# Patient Record
Sex: Male | Born: 1965
Health system: Southern US, Community
[De-identification: ages and names within clinical notes are randomized; demographics above are authoritative.]

## PROBLEM LIST (undated history)

## (undated) DIAGNOSIS — E119 Type 2 diabetes mellitus without complications: Secondary | ICD-10-CM

## (undated) HISTORY — PX: APPENDECTOMY: SHX54

---

## 2008-07-14 ENCOUNTER — Inpatient Hospital Stay (HOSPITAL_COMMUNITY): Admission: EM | Admit: 2008-07-14 | Discharge: 2008-07-16 | Payer: Self-pay | Admitting: Emergency Medicine

## 2008-08-30 ENCOUNTER — Ambulatory Visit: Payer: Self-pay | Admitting: Internal Medicine

## 2008-08-30 LAB — CONVERTED CEMR LAB
Alkaline Phosphatase: 62 units/L (ref 39–117)
BUN: 9 mg/dL (ref 6–23)
CO2: 23 meq/L (ref 19–32)
Cholesterol: 116 mg/dL (ref 0–200)
Creatinine, Ser: 0.94 mg/dL (ref 0.40–1.50)
Glucose, Bld: 203 mg/dL — ABNORMAL HIGH (ref 70–99)
HDL: 33 mg/dL — ABNORMAL LOW (ref >39)
LDL Cholesterol: 68 mg/dL (ref 0–99)
Sodium: 139 meq/L (ref 135–145)
Total Bilirubin: 1.1 mg/dL (ref 0.3–1.2)
Total Protein: 7 g/dL (ref 6.0–8.3)
Triglycerides: 74 mg/dL (ref <150)
VLDL: 15 mg/dL (ref 0–40)

## 2008-08-31 ENCOUNTER — Ambulatory Visit: Payer: Self-pay | Admitting: *Deleted

## 2009-10-10 ENCOUNTER — Emergency Department (HOSPITAL_COMMUNITY): Admission: EM | Admit: 2009-10-10 | Discharge: 2009-10-11 | Payer: Self-pay | Admitting: Emergency Medicine

## 2009-10-11 ENCOUNTER — Ambulatory Visit: Payer: Self-pay | Admitting: Psychiatry

## 2009-10-11 ENCOUNTER — Inpatient Hospital Stay (HOSPITAL_COMMUNITY): Admission: AD | Admit: 2009-10-11 | Discharge: 2009-10-16 | Payer: Self-pay | Admitting: Psychiatry

## 2009-11-01 ENCOUNTER — Ambulatory Visit: Payer: Self-pay | Admitting: Family Medicine

## 2010-05-17 ENCOUNTER — Emergency Department (HOSPITAL_COMMUNITY)
Admission: EM | Admit: 2010-05-17 | Discharge: 2010-05-17 | Payer: Self-pay | Source: Home / Self Care | Admitting: Emergency Medicine

## 2010-08-20 LAB — RAPID STREP SCREEN (MED CTR MEBANE ONLY): Streptococcus, Group A Screen (Direct): NEGATIVE

## 2010-08-27 LAB — GLUCOSE, CAPILLARY
Glucose-Capillary: 121 mg/dL — ABNORMAL HIGH (ref 70–99)
Glucose-Capillary: 125 mg/dL — ABNORMAL HIGH (ref 70–99)
Glucose-Capillary: 132 mg/dL — ABNORMAL HIGH (ref 70–99)
Glucose-Capillary: 134 mg/dL — ABNORMAL HIGH (ref 70–99)
Glucose-Capillary: 146 mg/dL — ABNORMAL HIGH (ref 70–99)
Glucose-Capillary: 149 mg/dL — ABNORMAL HIGH (ref 70–99)
Glucose-Capillary: 156 mg/dL — ABNORMAL HIGH (ref 70–99)
Glucose-Capillary: 169 mg/dL — ABNORMAL HIGH (ref 70–99)
Glucose-Capillary: 190 mg/dL — ABNORMAL HIGH (ref 70–99)
Glucose-Capillary: 195 mg/dL — ABNORMAL HIGH (ref 70–99)
Glucose-Capillary: 196 mg/dL — ABNORMAL HIGH (ref 70–99)
Glucose-Capillary: 220 mg/dL — ABNORMAL HIGH (ref 70–99)

## 2010-08-27 LAB — RAPID URINE DRUG SCREEN, HOSP PERFORMED
Amphetamines: NOT DETECTED
Benzodiazepines: NOT DETECTED
Cocaine: POSITIVE — AB

## 2010-08-27 LAB — COMPREHENSIVE METABOLIC PANEL
AST: 22 U/L (ref 0–37)
Albumin: 4.3 g/dL (ref 3.5–5.2)
BUN: 9 mg/dL (ref 6–23)
Creatinine, Ser: 1.01 mg/dL (ref 0.4–1.5)
GFR calc Af Amer: >60 mL/min (ref >60)
Potassium: 3.7 mEq/L (ref 3.5–5.1)
Total Protein: 7.1 g/dL (ref 6.0–8.3)

## 2010-08-27 LAB — CBC
HCT: 44.4 % (ref 39.0–52.0)
MCV: 93.5 fL (ref 78.0–100.0)
Platelets: 185 10*3/uL (ref 150–400)
RDW: 13.3 % (ref 11.5–15.5)
WBC: 8.8 10*3/uL (ref 4.0–10.5)

## 2010-08-27 LAB — DIFFERENTIAL
Eosinophils Relative: 2 % (ref 0–5)
Lymphocytes Relative: 28 % (ref 12–46)
Monocytes Absolute: 0.8 10*3/uL (ref 0.1–1.0)
Monocytes Relative: 9 % (ref 3–12)
Neutro Abs: 5.3 10*3/uL (ref 1.7–7.7)

## 2010-08-27 LAB — HEMOGLOBIN A1C
Hgb A1c MFr Bld: 6.8 % — ABNORMAL HIGH (ref <5.7)
Mean Plasma Glucose: 148 mg/dL — ABNORMAL HIGH (ref <117)

## 2010-08-27 LAB — ETHANOL: Alcohol, Ethyl (B): 51 mg/dL — ABNORMAL HIGH (ref 0–10)

## 2010-09-24 LAB — GLUCOSE, CAPILLARY
Glucose-Capillary: 150 mg/dL — ABNORMAL HIGH (ref 70–99)
Glucose-Capillary: 187 mg/dL — ABNORMAL HIGH (ref 70–99)
Glucose-Capillary: 270 mg/dL — ABNORMAL HIGH (ref 70–99)
Glucose-Capillary: 273 mg/dL — ABNORMAL HIGH (ref 70–99)
Glucose-Capillary: 277 mg/dL — ABNORMAL HIGH (ref 70–99)
Glucose-Capillary: 353 mg/dL — ABNORMAL HIGH (ref 70–99)
Glucose-Capillary: 364 mg/dL — ABNORMAL HIGH (ref 70–99)

## 2010-09-24 LAB — BASIC METABOLIC PANEL
BUN: 12 mg/dL (ref 6–23)
BUN: 13 mg/dL (ref 6–23)
BUN: 13 mg/dL (ref 6–23)
BUN: 14 mg/dL (ref 6–23)
BUN: 15 mg/dL (ref 6–23)
BUN: 9 mg/dL (ref 6–23)
CO2: 22 mEq/L (ref 19–32)
CO2: 22 mEq/L (ref 19–32)
CO2: 22 mEq/L (ref 19–32)
CO2: 22 mEq/L (ref 19–32)
CO2: 23 mEq/L (ref 19–32)
Calcium: 7.9 mg/dL — ABNORMAL LOW (ref 8.4–10.5)
Calcium: 8.1 mg/dL — ABNORMAL LOW (ref 8.4–10.5)
Calcium: 8.2 mg/dL — ABNORMAL LOW (ref 8.4–10.5)
Calcium: 8.2 mg/dL — ABNORMAL LOW (ref 8.4–10.5)
Chloride: 103 mEq/L (ref 96–112)
Chloride: 103 mEq/L (ref 96–112)
Chloride: 103 mEq/L (ref 96–112)
Chloride: 105 mEq/L (ref 96–112)
Chloride: 105 mEq/L (ref 96–112)
Chloride: 108 mEq/L (ref 96–112)
Creatinine, Ser: 0.92 mg/dL (ref 0.4–1.5)
Creatinine, Ser: 0.99 mg/dL (ref 0.4–1.5)
Creatinine, Ser: 1.1 mg/dL (ref 0.4–1.5)
Creatinine, Ser: 1.19 mg/dL (ref 0.4–1.5)
Creatinine, Ser: 1.2 mg/dL (ref 0.4–1.5)
GFR calc Af Amer: >60 mL/min (ref >60)
GFR calc Af Amer: >60 mL/min (ref >60)
GFR calc Af Amer: >60 mL/min (ref >60)
GFR calc non Af Amer: >60 mL/min (ref >60)
Glucose, Bld: 113 mg/dL — ABNORMAL HIGH (ref 70–99)
Glucose, Bld: 201 mg/dL — ABNORMAL HIGH (ref 70–99)
Glucose, Bld: 275 mg/dL — ABNORMAL HIGH (ref 70–99)
Glucose, Bld: 356 mg/dL — ABNORMAL HIGH (ref 70–99)
Potassium: 3.1 mEq/L — ABNORMAL LOW (ref 3.5–5.1)
Potassium: 3.3 mEq/L — ABNORMAL LOW (ref 3.5–5.1)
Potassium: 3.5 mEq/L (ref 3.5–5.1)
Potassium: 3.7 mEq/L (ref 3.5–5.1)
Sodium: 133 mEq/L — ABNORMAL LOW (ref 135–145)

## 2010-09-24 LAB — COMPREHENSIVE METABOLIC PANEL
ALT: 16 U/L (ref 0–53)
AST: 22 U/L (ref 0–37)
AST: 23 U/L (ref 0–37)
Albumin: 2.7 g/dL — ABNORMAL LOW (ref 3.5–5.2)
Albumin: 4.4 g/dL (ref 3.5–5.2)
Alkaline Phosphatase: 103 U/L (ref 39–117)
Alkaline Phosphatase: 68 U/L (ref 39–117)
Calcium: 8.1 mg/dL — ABNORMAL LOW (ref 8.4–10.5)
Chloride: 91 mEq/L — ABNORMAL LOW (ref 96–112)
Creatinine, Ser: 1.98 mg/dL — ABNORMAL HIGH (ref 0.4–1.5)
GFR calc Af Amer: 45 mL/min — ABNORMAL LOW (ref >60)
GFR calc Af Amer: >60 mL/min (ref >60)
Potassium: 3.8 mEq/L (ref 3.5–5.1)
Potassium: 5.1 mEq/L (ref 3.5–5.1)
Sodium: 138 mEq/L (ref 135–145)
Total Bilirubin: 2.1 mg/dL — ABNORMAL HIGH (ref 0.3–1.2)
Total Protein: 4.9 g/dL — ABNORMAL LOW (ref 6.0–8.3)

## 2010-09-24 LAB — CBC
HCT: 36.4 % — ABNORMAL LOW (ref 39.0–52.0)
Hemoglobin: 12.9 g/dL — ABNORMAL LOW (ref 13.0–17.0)
MCHC: 34.3 g/dL (ref 30.0–36.0)
MCHC: 34.9 g/dL (ref 30.0–36.0)
MCV: 91.3 fL (ref 78.0–100.0)
Platelets: 121 10*3/uL — ABNORMAL LOW (ref 150–400)
Platelets: 129 10*3/uL — ABNORMAL LOW (ref 150–400)
Platelets: 166 10*3/uL (ref 150–400)
RDW: 12.9 % (ref 11.5–15.5)
RDW: 13 % (ref 11.5–15.5)
WBC: 12.6 10*3/uL — ABNORMAL HIGH (ref 4.0–10.5)

## 2010-09-24 LAB — URINALYSIS, ROUTINE W REFLEX MICROSCOPIC
Bilirubin Urine: NEGATIVE
Glucose, UA: >1000 mg/dL — AB
Specific Gravity, Urine: 1.039 — ABNORMAL HIGH (ref 1.005–1.030)
Urobilinogen, UA: 0.2 mg/dL (ref 0.0–1.0)

## 2010-09-24 LAB — POCT I-STAT, CHEM 8
BUN: 19 mg/dL (ref 6–23)
Calcium, Ion: 1.14 mmol/L (ref 1.12–1.32)
Chloride: 104 mEq/L (ref 96–112)
Creatinine, Ser: 1.2 mg/dL (ref 0.4–1.5)
Creatinine, Ser: 1.2 mg/dL (ref 0.4–1.5)
Glucose, Bld: 349 mg/dL — ABNORMAL HIGH (ref 70–99)
Hemoglobin: 16.7 g/dL (ref 13.0–17.0)
Hemoglobin: 17.7 g/dL — ABNORMAL HIGH (ref 13.0–17.0)
Potassium: 4.3 mEq/L (ref 3.5–5.1)
Sodium: 135 mEq/L (ref 135–145)
TCO2: 18 mmol/L (ref 0–100)

## 2010-09-24 LAB — URINE MICROSCOPIC-ADD ON

## 2010-09-24 LAB — DRUGS OF ABUSE SCREEN W/O ALC, ROUTINE URINE
Amphetamine Screen, Ur: NEGATIVE
Barbiturate Quant, Ur: NEGATIVE
Benzodiazepines.: NEGATIVE
Creatinine,U: 28.1 mg/dL
Marijuana Metabolite: NEGATIVE
Methadone: NEGATIVE

## 2010-09-24 LAB — DIFFERENTIAL
Basophils Absolute: 0 10*3/uL (ref 0.0–0.1)
Eosinophils Relative: 0 % (ref 0–5)
Lymphocytes Relative: 11 % — ABNORMAL LOW (ref 12–46)
Monocytes Absolute: 0.7 10*3/uL (ref 0.1–1.0)
Monocytes Relative: 6 % (ref 3–12)

## 2010-09-24 LAB — LIPID PANEL
LDL Cholesterol: 82 mg/dL (ref 0–99)
VLDL: 30 mg/dL (ref 0–40)

## 2010-09-24 LAB — TROPONIN I: Troponin I: 0.02 ng/mL (ref 0.00–0.06)

## 2010-09-24 LAB — RETICULOCYTES
RBC.: 4.21 MIL/uL — ABNORMAL LOW (ref 4.22–5.81)
Retic Ct Pct: 0.5 % (ref 0.4–3.1)

## 2010-09-24 LAB — URINE CULTURE: Special Requests: NEGATIVE

## 2010-09-24 LAB — PHOSPHORUS
Phosphorus: 1.4 mg/dL — ABNORMAL LOW (ref 2.3–4.6)
Phosphorus: 1.7 mg/dL — ABNORMAL LOW (ref 2.3–4.6)
Phosphorus: 2.2 mg/dL — ABNORMAL LOW (ref 2.3–4.6)
Phosphorus: 2.8 mg/dL (ref 2.3–4.6)
Phosphorus: 2.8 mg/dL (ref 2.3–4.6)

## 2010-09-24 LAB — IRON AND TIBC
Iron: 139 ug/dL — ABNORMAL HIGH (ref 42–135)
UIBC: 63 ug/dL

## 2010-09-24 LAB — HEMOGLOBIN A1C: Hgb A1c MFr Bld: 15 % — ABNORMAL HIGH (ref 4.6–6.1)

## 2010-09-24 LAB — MAGNESIUM
Magnesium: 2 mg/dL (ref 1.5–2.5)
Magnesium: 2.2 mg/dL (ref 1.5–2.5)
Magnesium: 2.3 mg/dL (ref 1.5–2.5)
Magnesium: 2.6 mg/dL — ABNORMAL HIGH (ref 1.5–2.5)

## 2010-09-24 LAB — PROTIME-INR
INR: 1 (ref 0.00–1.49)
Prothrombin Time: 13.8 seconds (ref 11.6–15.2)

## 2010-09-24 LAB — FERRITIN: Ferritin: 542 ng/mL — ABNORMAL HIGH (ref 22–322)

## 2010-09-24 LAB — BRAIN NATRIURETIC PEPTIDE: Pro B Natriuretic peptide (BNP): 73 pg/mL (ref 0.0–100.0)

## 2010-09-24 LAB — TSH: TSH: 0.574 u[IU]/mL (ref 0.350–4.500)

## 2010-09-24 LAB — CULTURE, BLOOD (ROUTINE X 2): Culture: NO GROWTH

## 2010-09-24 LAB — CK TOTAL AND CKMB (NOT AT ARMC)
CK, MB: 3.3 ng/mL (ref 0.3–4.0)
Total CK: 243 U/L — ABNORMAL HIGH (ref 7–232)

## 2010-09-24 LAB — AMYLASE: Amylase: 49 U/L (ref 27–131)

## 2010-10-22 NOTE — H&P (Signed)
NAMECAMBRIDGE, DELEO NO.:  [id number]   MEDICAL RECORD NO.:  [id number]          PATIENT TYPE:  INP   LOCATION:  5123                         FACILITY:  [facility]   PHYSICIAN:  Carlena Hurl, MDDATE OF BIRTH:  September 20, 1965   DATE OF ADMISSION:  07/13/2008  DATE OF DISCHARGE:                              HISTORY & PHYSICAL   CHIEF COMPLAINT:  Abdominal pain, nausea, vomiting, and feeling very  weak and dizzy.   HISTORY OF PRESENT ILLNESS:  This is a 45 year old African American  gentleman with no past medical history who is coming into the ER with 2  days' history of abdominal pain, nausea, vomiting, and feeling  dizziness.  The history is obtained with the patient as well as the  patient's girlfriend who is at the bedside.  So according to them, this  patient was absolutely fine 2 days prior starting this episode.  He says  that on Wednesday, that was 2 days ago, the patient started having  abdominal pain in the midepigastric region.  He is also having severe  nausea and vomiting, he was unable to keep anything down except for the  Sprite, which he drank.  He was also feeling very dizzy.  His girlfriend  says that the patient was walking very lethargically and he was feeling  dizzy.  He denies having any fever, constipation, diarrhea, no headache.  There is no weight loss.  This patient was having increased frequency of  urination and he was feeling very thirsty since December 2009.   PAST MEDICAL HISTORY:  Nothing significant.   PAST SURGICAL HISTORY:  He had 1 tooth removed, but otherwise not  significant.   ALLERGIES:  No known drug allergies.   SOCIAL HISTORY:  The patient is a heavy smoker.  He smokes about 1 pack  in 2 days.  He has been smoking for the past 10 years.  The patient has  a girlfriend and lives with her.  He denies alcohol or IV drug abuse.   REVIEW OF SYSTEMS:  He has increased frequency of urination, feeling  very thirsty,  abdominal pain, nausea, vomiting.   MEDICATIONS:  He takes ibuprofen as needed for pain, otherwise none.   PHYSICAL EXAMINATION:  GENERAL:  This is a 45 year old African American  gentleman who is lying comfortably on the bed without any severe chest  pain or shortness of breath.  VITALS:  Blood pressure when he came into the ER 142/89 and later  dropped down to 136/90, pulse rate of 95, which later dropped down to  74, respiratory rate of 18, temperature 96.9.  HEENT:  Head is atraumatic, normocephalic.  PERRLA.  Tympanic membranes  intact.  No discharge from eyes or ears.  NECK:  Supple.  No JVD noted.  No goiter appreciated.  LUNGS:  Clear to auscultation bilaterally.  CVS:  S1 and S2 heard with a regular rate and rhythm.  ABDOMEN:  Soft.  Bowel sounds present, nontender, nondistended.  EXTREMITIES:  No pedal edema noted.  Pulses are palpable bilaterally.  No cyanosis.  No clubbing.  CVS:  Awake, alert, and  oriented x3.  No focal neurological deficits  noted.   LABORATORY DATA:  WBC of 12.6, hemoglobin 7.0, hematocrit of 50.5,  platelets of 166, neutrophils 86%.  Sodium 124, potassium 5.1, chloride  91, bicarb of 15, glucose of 642, creatinine 1.98.  Total bilirubin 2.1,  alkaline phosphatase of 103, AST and ALT are within normal limits.  Lipase of 58.  Urinalysis shows urine glucose greater than 1000, pH of  5.5, moderate amount of blood, but otherwise no nitrites and no wbc  noted.   ASSESSMENT AND PLAN:  This is a  45 year old gentleman who is coming in  with 2 days' episode of abdominal pain, nausea, vomiting, and 67-month  history of increased frequency of urination, feeling thirsty.  1. Acute diabetic ketoacidosis secondary to new onset diabetes      mellitus.  The patient's anion gap is 18.  At this time clinically,      the patient is stable.  So, the patient has been started in the ER      with IV insulin and the patient was given IV fluid bolus as well as      now the  IV fluids are wide open.  At this time, we will continue      this patient on IV insulin, NovoLog insulin, at the rate of 0.1      units/kg/hour.  Also, we will check his blood glucose every 1 hour      and Chem-7, magnesium, phosphorus every 4 hours.  So, once the      patient's anion gap closes, we will add Lantus subcutaneously and      slowly taper the IV insulin.  2. New onset diabetes mellitus.  The patient states that his      grandparents and uncles have history of diabetes, so as the patient      never had any history of diabetes, but had 108-month history of      increased urination and increased thirsty, at this time, I feel it      is new onset diabetes mellitus.  Once his diabetic ketoacidosis      resolves, we will start this patient on oral medications and get a      diabetic education.  3. Hyponatremia, it is most likely secondary to diabetic ketoacidosis      and we will monitor his sodium.  Once the patient's diabetic      ketoacidosis resolves, his sodium should normalize.  4. Hyperkalemia.  It is secondary to diabetic ketoacidosis.  5. Acute kidney injury.  The patient denies having any previous      history of kidney injuries.  At this time, it is most likely      secondary to prerenal condition secondary to dehydration for      diabetic ketoacidosis.  So, the patient has been given IV normal      saline.  So, we will monitor his creatinine.  6. History of smoking.  The patient will be given smoking education at      the end of the discharge.  7. Deep venous thrombosis prophylaxis.  The patient will be started on      heparin 5000 units subcu 1 time a day.  8. Gastrointestinal prophylaxis, the patient will be placed on      Protonix IV 40 mg 1 time a day.      Carlena Hurl, MD  Electronically Signed     JD/MEDQ  D:  07/14/2008  T:  07/14/2008  Job:  [id number]

## 2010-10-22 NOTE — Discharge Summary (Signed)
[name], [name] NO.:  [id number]   MEDICAL RECORD NO.:  [id number]          PATIENT TYPE:  INP   LOCATION:  5123                         FACILITY:  [facility]   PHYSICIAN:  Peggye Pitt, M.D. DATE OF BIRTH:  11/17/1965   DATE OF ADMISSION:  07/13/2008  DATE OF DISCHARGE:  07/16/2008                               DISCHARGE SUMMARY   DISCHARGE DIAGNOSES:  1. Diabetic ketoacidosis, resolved.  2. Type 1 diabetes mellitus, newly diagnosed.   DISCHARGE MEDICATION:  Insulin 70/30 15 units in the morning before  breakfast and 12 units in the evening before dinner.   DISPOSITION AND FOLLOWUP:  The patient is discharged home in a stable  condition.  His diabetic ketoacidosis has resolved.  I have recommended  that he have a close follow up with a primary care physician to further  adjust his insulin and keep him out of DKA.  He will need a further  diabetic teaching including diet and insulin instruction.  He does not  currently have a primary care physician, so I have given him the number  for [facility] so that he may call on Monday and schedule this  appointment.   CONSULTATIONS IN THIS HOSPITALIZATION:  None.   IMAGES AND PROCEDURES:  Performed during this hospitalization include a  chest x-ray on July 14, 2008, that showed no active cardiopulmonary  disease.   HISTORY AND PHYSICAL EXAMINATION:  For full details, please refer to  history and physical dictated on July 14, 2008, by Dr. Cherylin Mylar,  but in brief, [name] is a very pleasant 45 year old African  American man with no significant past medical history who came into the  emergency department with 2-day history of abdominal pain, nausea,  vomiting, and dizziness.  He was diagnosed with diabetic ketoacidosis  and for that reason, the Medicine and Hospitalist Service was called for  further evaluation and management.   HOSPITAL COURSE:  1. Diabetic ketoacidosis.  He was immediately and  aggressively fluid      repleted with normal saline.  He was also started on an insulin      drip with frequent CBGs as well as chemistry and electrolyte      measurements.  Once his DKA had resolved, he was transitioned over      to a subcutaneous regimen of insulin 70/30.  He has tolerated this      well.  Upon discharge, his CBGs remained quite elevated in the high      200s to low 300s, but his CBGs and his insulin can be further      monitored and titrated as an outpatient.  He has been provided with      diabetic teaching in the hospital by the ways of nutrition and      insulin administration, although he will likely need further      aggressive diabetic teaching in the outpatient setting.  2. Vital signs upon discharge; blood pressure 119/79, heart rate 63,      respirations 16, and O2 sats 98% on room air      with a temperature  of 99.0.  3. Labs on day of discharge; sodium 135, potassium 3.5, chloride 103,      bicarb 24, BUN 9, creatinine 0.92 with a glucose of 275.  WBC is      5.4, hemoglobin 12.5, and platelets of 129.  His hemoglobin A1c was      15.      Peggye Pitt, M.D.  Electronically Signed     EH/MEDQ  D:  07/16/2008  T:  07/17/2008  Job:  [id number]

## 2013-06-28 ENCOUNTER — Emergency Department (HOSPITAL_BASED_OUTPATIENT_CLINIC_OR_DEPARTMENT_OTHER): Payer: Self-pay

## 2013-06-28 ENCOUNTER — Emergency Department (HOSPITAL_BASED_OUTPATIENT_CLINIC_OR_DEPARTMENT_OTHER)
Admission: EM | Admit: 2013-06-28 | Discharge: 2013-06-28 | Disposition: A | Discharge status: Home / Self Care | Payer: Self-pay | Attending: Emergency Medicine | Admitting: Emergency Medicine

## 2013-06-28 ENCOUNTER — Encounter (HOSPITAL_BASED_OUTPATIENT_CLINIC_OR_DEPARTMENT_OTHER): Payer: Self-pay | Admitting: Emergency Medicine

## 2013-06-28 DIAGNOSIS — F17200 Nicotine dependence, unspecified, uncomplicated: Secondary | ICD-10-CM | POA: Insufficient documentation

## 2013-06-28 DIAGNOSIS — Z794 Long term (current) use of insulin: Secondary | ICD-10-CM | POA: Insufficient documentation

## 2013-06-28 DIAGNOSIS — E109 Type 1 diabetes mellitus without complications: Secondary | ICD-10-CM | POA: Insufficient documentation

## 2013-06-28 DIAGNOSIS — R079 Chest pain, unspecified: Secondary | ICD-10-CM | POA: Insufficient documentation

## 2013-06-28 HISTORY — DX: Type 2 diabetes mellitus without complications: E11.9

## 2013-06-28 LAB — TROPONIN I: Troponin I: <0.3 ng/mL (ref <0.30)

## 2013-06-28 LAB — COMPREHENSIVE METABOLIC PANEL
ALT: 25 U/L (ref 0–53)
AST: 19 U/L (ref 0–37)
Albumin: 4.3 g/dL (ref 3.5–5.2)
Alkaline Phosphatase: 69 U/L (ref 39–117)
BUN: 14 mg/dL (ref 6–23)
CO2: 26 mEq/L (ref 19–32)
Calcium: 9.4 mg/dL (ref 8.4–10.5)
Chloride: 100 mEq/L (ref 96–112)
Creatinine, Ser: 0.9 mg/dL (ref 0.50–1.35)
GFR calc Af Amer: >90 mL/min (ref >90)
GFR calc non Af Amer: >90 mL/min (ref >90)
Glucose, Bld: 177 mg/dL — ABNORMAL HIGH (ref 70–99)
Potassium: 4 mEq/L (ref 3.7–5.3)
Sodium: 140 mEq/L (ref 137–147)
Total Bilirubin: 0.5 mg/dL (ref 0.3–1.2)
Total Protein: 7.1 g/dL (ref 6.0–8.3)

## 2013-06-28 LAB — CBC WITH DIFFERENTIAL/PLATELET
Basophils Absolute: 0 10*3/uL (ref 0.0–0.1)
Basophils Relative: 0 % (ref 0–1)
Eosinophils Absolute: 0.2 10*3/uL (ref 0.0–0.7)
Eosinophils Relative: 2 % (ref 0–5)
HCT: 44.7 % (ref 39.0–52.0)
Hemoglobin: 15.2 g/dL (ref 13.0–17.0)
Lymphocytes Relative: 34 % (ref 12–46)
Lymphs Abs: 3.4 10*3/uL (ref 0.7–4.0)
MCH: 30 pg (ref 26.0–34.0)
MCHC: 34 g/dL (ref 30.0–36.0)
MCV: 88.2 fL (ref 78.0–100.0)
Monocytes Absolute: 0.9 10*3/uL (ref 0.1–1.0)
Monocytes Relative: 8 % (ref 3–12)
Neutro Abs: 5.6 10*3/uL (ref 1.7–7.7)
Neutrophils Relative %: 55 % (ref 43–77)
Platelets: 179 10*3/uL (ref 150–400)
RBC: 5.07 MIL/uL (ref 4.22–5.81)
RDW: 12.7 % (ref 11.5–15.5)
WBC: 10.1 10*3/uL (ref 4.0–10.5)

## 2013-06-28 LAB — LIPASE, BLOOD: LIPASE: 26 U/L (ref 11–59)

## 2013-06-28 LAB — D-DIMER, QUANTITATIVE (NOT AT ARMC)

## 2013-06-28 MED ORDER — KETOROLAC TROMETHAMINE 30 MG/ML IJ SOLN
30.0000 mg | Freq: Once | INTRAMUSCULAR | Status: AC
  Administered 2013-06-28: 30 mg via INTRAVENOUS
  Filled 2013-06-28: qty 1

## 2013-06-28 NOTE — ED Notes (Signed)
States he has been having sharp left sided chest pain that sometimes goes to the center of his chest and under his ribs for a month. Recently he has experienced SOB with the pain.

## 2013-06-28 NOTE — Discharge Instructions (Signed)
Ibuprofen 600 mg 3 times daily for the next 5 days.  If your symptoms are not improving in the next week, followup with your primary Dr. If symptoms worsen or change in the meantime, return to the ER to be reevaluated.   Chest Pain (Nonspecific) It is often hard to give a specific diagnosis for the cause of chest pain. There is always a chance that your pain could be related to something serious, such as a heart attack or a blood clot in the lungs. You need to follow up with your caregiver for further evaluation. CAUSES   Heartburn.  Pneumonia or bronchitis.  Anxiety or stress.  Inflammation around your heart (pericarditis) or lung (pleuritis or pleurisy).  A blood clot in the lung.  A collapsed lung (pneumothorax). It can develop suddenly on its own (spontaneous pneumothorax) or from injury (trauma) to the chest.  Shingles infection (herpes zoster virus). The chest wall is composed of bones, muscles, and cartilage. Any of these can be the source of the pain.  The bones can be bruised by injury.  The muscles or cartilage can be strained by coughing or overwork.  The cartilage can be affected by inflammation and become sore (costochondritis). DIAGNOSIS  Lab tests or other studies, such as X-rays, electrocardiography, stress testing, or cardiac imaging, may be needed to find the cause of your pain.  TREATMENT   Treatment depends on what may be causing your chest pain. Treatment may include:  Acid blockers for heartburn.  Anti-inflammatory medicine.  Pain medicine for inflammatory conditions.  Antibiotics if an infection is present.  You may be advised to change lifestyle habits. This includes stopping smoking and avoiding alcohol, caffeine, and chocolate.  You may be advised to keep your head raised (elevated) when sleeping. This reduces the chance of acid going backward from your stomach into your esophagus.  Most of the time, nonspecific chest pain will improve within 2  to 3 days with rest and mild pain medicine. HOME CARE INSTRUCTIONS   If antibiotics were prescribed, take your antibiotics as directed. Finish them even if you start to feel better.  For the next few days, avoid physical activities that bring on chest pain. Continue physical activities as directed.  Do not smoke.  Avoid drinking alcohol.  Only take over-the-counter or prescription medicine for pain, discomfort, or fever as directed by your caregiver.  Follow your caregiver's suggestions for further testing if your chest pain does not go away.  Keep any follow-up appointments you made. If you do not go to an appointment, you could develop lasting (chronic) problems with pain. If there is any problem keeping an appointment, you must call to reschedule. SEEK MEDICAL CARE IF:   You think you are having problems from the medicine you are taking. Read your medicine instructions carefully.  Your chest pain does not go away, even after treatment.  You develop a rash with blisters on your chest. SEEK IMMEDIATE MEDICAL CARE IF:   You have increased chest pain or pain that spreads to your arm, neck, jaw, back, or abdomen.  You develop shortness of breath, an increasing cough, or you are coughing up blood.  You have severe back or abdominal pain, feel nauseous, or vomit.  You develop severe weakness, fainting, or chills.  You have a fever. THIS IS AN EMERGENCY. Do not wait to see if the pain will go away. Get medical help at once. Call your local emergency services (911 in [place] Do not drive yourself to  the hospital. MAKE SURE YOU:   Understand these instructions.  Will watch your condition.  Will get help right away if you are not doing well or get worse. Document Released: 03/05/2005 Document Revised: 08/18/2011 Document Reviewed: 12/30/2007 [facility] Patient Information 2014 [place].

## 2013-06-28 NOTE — ED Provider Notes (Signed)
CSN: [id number]     Arrival date & time 06/28/13  1618 History   First MD Initiated Contact with Patient 06/28/13 1629     Chief Complaint  Patient presents with  . Chest Pain   (Consider location/radiation/quality/duration/timing/severity/associated sxs/prior Treatment) HPI Comments: Patient is a 48 year old male with history of type 1 diabetes diagnosed in 2010. He presents today with complaints of sharp pains in the front of his chest which have been present for the past 2 months. He denies any injury or trauma and denies any shortness of breath, nausea, diaphoresis, or radiation to the arm or jaw. He states his pain is worse with movement and position. 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 He has no prior cardiac history and has no other cardiac risk factors.  Patient is a 48 y.o. male presenting with chest pain. The history is provided by the patient.  Chest Pain Chest pain location: Anterior chest. Pain quality: sharp   Pain radiates to:  Does not radiate Pain radiates to the back: no   Pain severity:  Moderate Onset quality:  Gradual Duration:  2 months Timing:  Constant Progression:  Worsening Chronicity:  New Context: lifting and movement   Context: not breathing and not eating   Relieved by:  Nothing Worsened by:  Certain positions and movement Ineffective treatments:  None tried   Past Medical History  Diagnosis Date  . Diabetes mellitus without complication    History reviewed. No pertinent past surgical history. No family history on file. History  Substance Use Topics  . Smoking status: Current Every Day Smoker -- 0.50 packs/day    Types: Cigarettes  . Smokeless tobacco: Not on file  . Alcohol Use: No    Review of Systems  Cardiovascular: Positive for chest pain.  All other systems reviewed and are negative.    Allergies  Review of patient's allergies indicates no known allergies.  Home Medications   Current Outpatient  Rx  Name  Route  Sig  Dispense  Refill  . insulin NPH Human (HUMULIN N,NOVOLIN N) 100 UNIT/ML injection   Subcutaneous   Inject into the skin.         Marland Kitchen METFORMIN HCL PO   Oral   Take by mouth.          BP 136/92  Pulse 74  Temp(Src) 98.2 F (36.8 C) (Oral)  Resp 18  Ht 5\' 11"  (1.803 m)  Wt 200 lb (90.719 kg)  BMI 27.91 kg/m2  SpO2 98% Physical Exam  Nursing note and vitals reviewed. Constitutional: He is oriented to person, place, and time. He appears well-developed and well-nourished. No distress.  HENT:  Head: Normocephalic and atraumatic.  Mouth/Throat: Oropharynx is clear and moist.  Neck: Normal range of motion. Neck supple.  Cardiovascular: Normal rate, regular rhythm and normal heart sounds.   No murmur heard. Pulmonary/Chest: Effort normal and breath sounds normal. No respiratory distress. He has no wheezes.  Abdominal: Soft. Bowel sounds are normal. He exhibits no distension and no mass. There is no tenderness. There is no rebound and no guarding.  Musculoskeletal: Normal range of motion. He exhibits no edema.  Neurological: He is alert and oriented to person, place, and time.  Skin: Skin is warm and dry. He is not diaphoretic.    ED Course  Procedures (including critical care time) Labs Review Labs Reviewed  CBC WITH DIFFERENTIAL  COMPREHENSIVE METABOLIC PANEL  TROPONIN I  LIPASE, BLOOD  D-DIMER, QUANTITATIVE  Imaging Review No results found.  EKG Interpretation    Date/Time:  Tuesday June 28 2013 16:24:00 EST Ventricular Rate:  74 PR Interval:  148 QRS Duration: 94 QT Interval:  362 QTC Calculation: 401 R Axis:   -9 Text Interpretation:  Normal sinus rhythm Possible Anterior infarct , age undetermined Abnormal ECG Confirmed by DELOS  MD, Letia Guidry ([id number]) on 06/28/2013 4:29:38 PM            MDM  No diagnosis found. Patient is a 48 year old male presents with complaints of pain in the center of his chest for the past 2 months. He  denies any injury or trauma. His symptoms do not sound cardiac in nature. His EKG and troponin are both negative. Other considerations are PE, however his d-dimer is negative and I have a low suspicion. I do not feel as though further testing is indicated into this. There is no evidence for pneumothorax, neoplasm, dissection, or other acute pathology on his chest x-ray. His symptoms sound musculoskeletal in nature. I will recommend rest, ibuprofen, and time. It is not improving he is to followup with his primary Dr. and return to the emergency department if his symptoms substantially worsen or change.    Geoffery Lyonsouglas Florance Paolillo, MD 06/28/13 [phone]

## 2013-12-27 ENCOUNTER — Encounter (HOSPITAL_BASED_OUTPATIENT_CLINIC_OR_DEPARTMENT_OTHER): Payer: Self-pay | Admitting: Emergency Medicine

## 2013-12-27 ENCOUNTER — Emergency Department (HOSPITAL_BASED_OUTPATIENT_CLINIC_OR_DEPARTMENT_OTHER)
Admission: EM | Admit: 2013-12-27 | Discharge: 2013-12-27 | Disposition: A | Discharge status: Home / Self Care | Payer: Self-pay | Attending: Emergency Medicine | Admitting: Emergency Medicine

## 2013-12-27 DIAGNOSIS — E119 Type 2 diabetes mellitus without complications: Secondary | ICD-10-CM | POA: Insufficient documentation

## 2013-12-27 DIAGNOSIS — Z794 Long term (current) use of insulin: Secondary | ICD-10-CM | POA: Insufficient documentation

## 2013-12-27 DIAGNOSIS — F17200 Nicotine dependence, unspecified, uncomplicated: Secondary | ICD-10-CM | POA: Insufficient documentation

## 2013-12-27 DIAGNOSIS — K029 Dental caries, unspecified: Secondary | ICD-10-CM | POA: Insufficient documentation

## 2013-12-27 MED ORDER — BUPIVACAINE-EPINEPHRINE (PF) 0.5% -1:200000 IJ SOLN
1.8000 mL | Freq: Once | INTRAMUSCULAR | Status: AC
  Administered 2013-12-27: 1.8 mL

## 2013-12-27 MED ORDER — BUPIVACAINE-EPINEPHRINE (PF) 0.5% -1:200000 IJ SOLN
INTRAMUSCULAR | Status: AC
  Administered 2013-12-27: 1.8 mL
  Filled 2013-12-27: qty 1.8

## 2013-12-27 MED ORDER — PENICILLIN V POTASSIUM 500 MG PO TABS: 500.0000 mg | ORAL_TABLET | Freq: Four times a day (QID) | ORAL | Status: AC

## 2013-12-27 MED ORDER — HYDROCODONE-ACETAMINOPHEN 5-325 MG PO TABS: 1.0000 | ORAL_TABLET | Freq: Four times a day (QID) | ORAL | Status: DC | PRN

## 2013-12-27 NOTE — Discharge Instructions (Signed)

## 2013-12-27 NOTE — ED Notes (Signed)
MD at bedside. 

## 2013-12-27 NOTE — ED Notes (Signed)
Pt. Has an abscess in the gum above a broken tooth in the R upper quadrant.  Pt. Has noted facial edema with pain in his face.

## 2013-12-27 NOTE — ED Provider Notes (Signed)
CSN: [id number]     Arrival date & time 12/27/13  2145 History  This chart was scribed for Hanley Seamen, MD by Bronson Curb, ED Scribe. This patient was seen in room MH03/MH03 and the patient's care was started at 11:04 PM.    Chief Complaint  Patient presents with  . Dental Pain      The history is provided by the patient. No language interpreter was used.    HPI Comments: [name] is a 48 y.o. male who presents to the Emergency Department complaining of moderate to severe, right upper dental pain onset 3 days ago caused by a broken tooth. Patient states the pain radiates to his right eye. There is associated right sided facial pain and mild swelling. Pain is worse with eating or drinking. He does not have a Education officer, community.   Past Medical History  Diagnosis Date  . Diabetes mellitus without complication    History reviewed. No pertinent past surgical history. No family history on file. History  Substance Use Topics  . Smoking status: Current Every Day Smoker -- 0.50 packs/day    Types: Cigarettes  . Smokeless tobacco: Not on file  . Alcohol Use: No    Review of Systems  A complete 10 system review of systems was obtained and all systems are negative except as noted in the HPI and PMH.    Allergies  Review of patient's allergies indicates no known allergies.  Home Medications   Prior to Admission medications   Medication Sig Start Date End Date Taking? Authorizing Provider  insulin NPH Human (HUMULIN N,NOVOLIN N) 100 UNIT/ML injection Inject into the skin.    Historical Provider, MD  METFORMIN HCL PO Take by mouth.    Historical Provider, MD   Triage Vitals: BP 122/81  Resp 18  Ht 5\' 10"  (1.778 m)  Wt 195 lb (88.451 kg)  BMI 27.98 kg/m2  SpO2 98%  Physical Exam General: Well-developed, well-nourished male in no acute distress; appearance consistent with age of record HENT: normocephalic; atraumatic. Fractured, carious right upper 1st premolar, tender to  percussion; mild edema of adjacent soft tissue. Eyes: pupils equal, round and reactive to light; extraocular muscles intact Neck: supple. No lymphadenopathy.  Heart: regular rate and rhythm Lungs: clear to auscultation bilaterally Abdomen: soft; nondistended; nontender; no masses or hepatosplenomegaly; bowel sounds present Extremities: No deformity; full range of motion Neurologic: Awake, alert and oriented; motor function intact in all extremities and symmetric; no facial droop Skin: Warm and dry Psychiatric: Normal mood and affect    ED Course  Procedures (including critical care time)  DIAGNOSTIC STUDIES: Oxygen Saturation is 98% on room air, normal by my interpretation.    COORDINATION OF CARE:  11:09 PM- Pt advised of plan for treatment and pt agrees.  DENTAL BLOCK 0.5% with epinephrine was injected into the buccal fold adjacent to the right upper first premolar. Adequate analgesia was obtained. The patient tolerated this well and there were no immediate complications.  MDM  I personally performed the services described in this documentation, which was scribed in my presence. The recorded information has been reviewed and is accurate.   Hanley Seamen, MD 12/27/13 [phone]

## 2014-05-03 ENCOUNTER — Emergency Department (HOSPITAL_BASED_OUTPATIENT_CLINIC_OR_DEPARTMENT_OTHER)
Admission: EM | Admit: 2014-05-03 | Discharge: 2014-05-03 | Disposition: A | Discharge status: Home / Self Care | Payer: Self-pay | Attending: Emergency Medicine | Admitting: Emergency Medicine

## 2014-05-03 ENCOUNTER — Encounter (HOSPITAL_BASED_OUTPATIENT_CLINIC_OR_DEPARTMENT_OTHER): Payer: Self-pay

## 2014-05-03 ENCOUNTER — Emergency Department (HOSPITAL_BASED_OUTPATIENT_CLINIC_OR_DEPARTMENT_OTHER): Payer: Self-pay

## 2014-05-03 DIAGNOSIS — Z794 Long term (current) use of insulin: Secondary | ICD-10-CM | POA: Insufficient documentation

## 2014-05-03 DIAGNOSIS — J40 Bronchitis, not specified as acute or chronic: Secondary | ICD-10-CM

## 2014-05-03 DIAGNOSIS — J209 Acute bronchitis, unspecified: Secondary | ICD-10-CM | POA: Insufficient documentation

## 2014-05-03 DIAGNOSIS — Z720 Tobacco use: Secondary | ICD-10-CM | POA: Insufficient documentation

## 2014-05-03 DIAGNOSIS — R05 Cough: Secondary | ICD-10-CM | POA: Insufficient documentation

## 2014-05-03 DIAGNOSIS — R059 Cough, unspecified: Secondary | ICD-10-CM

## 2014-05-03 DIAGNOSIS — E119 Type 2 diabetes mellitus without complications: Secondary | ICD-10-CM | POA: Insufficient documentation

## 2014-05-03 DIAGNOSIS — Z79899 Other long term (current) drug therapy: Secondary | ICD-10-CM | POA: Insufficient documentation

## 2014-05-03 MED ORDER — HYDROCOD POLST-CHLORPHEN POLST 10-8 MG/5ML PO LQCR
5.0000 mL | Freq: Once | ORAL | Status: AC
  Administered 2014-05-03: 5 mL via ORAL
  Filled 2014-05-03: qty 5

## 2014-05-03 MED ORDER — AZITHROMYCIN 250 MG PO TABS: 250.0000 mg | ORAL_TABLET | Freq: Every day | ORAL | Status: DC

## 2014-05-03 MED ORDER — HYDROCODONE-HOMATROPINE 5-1.5 MG/5ML PO SYRP: 5.0000 mL | ORAL_SOLUTION | Freq: Four times a day (QID) | ORAL | Status: DC | PRN

## 2014-05-03 NOTE — Discharge Instructions (Signed)

## 2014-05-03 NOTE — ED Provider Notes (Signed)
CSN: [id number]     Arrival date & time 05/03/14  1340 History   First MD Initiated Contact with Patient 05/03/14 1350     Chief Complaint  Patient presents with  . Cough     (Consider location/radiation/quality/duration/timing/severity/associated sxs/prior Treatment) Patient is a 48 y.o. male presenting with cough. The history is provided by the patient. No language interpreter was used.  Cough Cough characteristics:  Productive Sputum characteristics:  Clear Severity:  Moderate Onset quality:  Gradual Duration:  3 weeks Timing:  Constant Progression:  Worsening Chronicity:  New Smoker: yes   Context: upper respiratory infection   Relieved by:  Nothing Associated symptoms: no chest pain and no fever   Risk factors: no recent infection     Past Medical History  Diagnosis Date  . Diabetes mellitus without complication    Past Surgical History  Procedure Laterality Date  . Appendectomy     No family history on file. History  Substance Use Topics  . Smoking status: Current Every Day Smoker -- 0.50 packs/day    Types: Cigarettes  . Smokeless tobacco: Not on file  . Alcohol Use: No    Review of Systems  Constitutional: Negative for fever.  Respiratory: Positive for cough.   Cardiovascular: Negative for chest pain.  All other systems reviewed and are negative.     Allergies  Review of patient's allergies indicates no known allergies.  Home Medications   Prior to Admission medications   Medication Sig Start Date End Date Taking? Authorizing Provider  insulin NPH Human (HUMULIN N,NOVOLIN N) 100 UNIT/ML injection Inject into the skin.    Historical Provider, MD  METFORMIN HCL PO Take by mouth.    Historical Provider, MD   BP 134/82 mmHg  Pulse 75  Temp(Src) 98.1 F (36.7 C) (Oral)  Resp 18  Ht 5\' 10"  (1.778 m)  Wt 210 lb (95.255 kg)  BMI 30.13 kg/m2  SpO2 98% Physical Exam  Constitutional: He is oriented to person, place, and time. He appears  well-developed and well-nourished.  HENT:  Head: Normocephalic and atraumatic.  Eyes: EOM are normal. Pupils are equal, round, and reactive to light.  Neck: Normal range of motion. Neck supple.  Cardiovascular: Normal rate and normal heart sounds.   Pulmonary/Chest: Effort normal and breath sounds normal.  Abdominal: He exhibits no distension.  Musculoskeletal: Normal range of motion.  Neurological: He is alert and oriented to person, place, and time.  Skin: Skin is warm.  Psychiatric: He has a normal mood and affect.  Nursing note and vitals reviewed.   ED Course  Procedures (including critical care time) Labs Review Labs Reviewed - No data to display  Imaging Review Dg Chest 2 View  05/03/2014   CLINICAL DATA:  Cough for 3 weeks  EXAM: CHEST  2 VIEW  COMPARISON:  06/28/2013  FINDINGS: Normal mediastinum and cardiac silhouette. Normal pulmonary vasculature. No evidence of effusion, infiltrate, or pneumothorax. No acute bony abnormality. Scoliosis noted.  IMPRESSION: No acute cardiopulmonary process.   Electronically Signed   By: Genevive BiStewart  Edmunds M.D.   On: 05/03/2014 14:37     EKG Interpretation None      MDM   Final diagnoses:  Cough  Bronchitis    Pt given tussionex here.  Decreased cough.   Rx for hydromet.Rx for zithromax Pt advised to follow up with his Md for recheck    Elson AreasLeslie K Cyril Railey, PA-C 05/03/14 1448  Gilda Creasehristopher J. Pollina, MD 05/03/14 [phone]

## 2014-05-03 NOTE — ED Notes (Signed)
C/o prod cough x 3 weeks

## 2014-05-31 ENCOUNTER — Emergency Department (HOSPITAL_BASED_OUTPATIENT_CLINIC_OR_DEPARTMENT_OTHER)
Admission: EM | Admit: 2014-05-31 | Discharge: 2014-05-31 | Disposition: A | Discharge status: Home / Self Care | Payer: Self-pay | Attending: Emergency Medicine | Admitting: Emergency Medicine

## 2014-05-31 ENCOUNTER — Emergency Department (HOSPITAL_BASED_OUTPATIENT_CLINIC_OR_DEPARTMENT_OTHER): Payer: Self-pay

## 2014-05-31 ENCOUNTER — Encounter (HOSPITAL_BASED_OUTPATIENT_CLINIC_OR_DEPARTMENT_OTHER): Payer: Self-pay | Admitting: Emergency Medicine

## 2014-05-31 DIAGNOSIS — R05 Cough: Secondary | ICD-10-CM | POA: Insufficient documentation

## 2014-05-31 DIAGNOSIS — Z79899 Other long term (current) drug therapy: Secondary | ICD-10-CM | POA: Insufficient documentation

## 2014-05-31 DIAGNOSIS — R059 Cough, unspecified: Secondary | ICD-10-CM

## 2014-05-31 DIAGNOSIS — E119 Type 2 diabetes mellitus without complications: Secondary | ICD-10-CM | POA: Insufficient documentation

## 2014-05-31 DIAGNOSIS — J029 Acute pharyngitis, unspecified: Secondary | ICD-10-CM | POA: Insufficient documentation

## 2014-05-31 DIAGNOSIS — Z794 Long term (current) use of insulin: Secondary | ICD-10-CM | POA: Insufficient documentation

## 2014-05-31 DIAGNOSIS — Z720 Tobacco use: Secondary | ICD-10-CM | POA: Insufficient documentation

## 2014-05-31 LAB — CBG MONITORING, ED
GLUCOSE-CAPILLARY: 397 mg/dL — AB (ref 70–99)
Glucose-Capillary: 408 mg/dL — ABNORMAL HIGH (ref 70–99)

## 2014-05-31 MED ORDER — ALBUTEROL SULFATE HFA 108 (90 BASE) MCG/ACT IN AERS: 1.0000 | INHALATION_SPRAY | Freq: Four times a day (QID) | RESPIRATORY_TRACT | Status: DC | PRN

## 2014-05-31 MED ORDER — BENZONATATE 100 MG PO CAPS: 100.0000 mg | ORAL_CAPSULE | Freq: Three times a day (TID) | ORAL | Status: DC

## 2014-05-31 MED ORDER — PREDNISONE 20 MG PO TABS: ORAL_TABLET | ORAL | Status: DC

## 2014-05-31 MED ORDER — METFORMIN HCL 500 MG PO TABS
500.0000 mg | ORAL_TABLET | Freq: Once | ORAL | Status: AC
  Administered 2014-05-31: 500 mg via ORAL
  Filled 2014-05-31: qty 1

## 2014-05-31 MED ORDER — BENZONATATE 100 MG PO CAPS
200.0000 mg | ORAL_CAPSULE | Freq: Once | ORAL | Status: AC
  Administered 2014-05-31: 200 mg via ORAL
  Filled 2014-05-31: qty 2

## 2014-05-31 MED ORDER — INSULIN REGULAR HUMAN 100 UNIT/ML IJ SOLN
18.0000 [IU] | Freq: Once | INTRAMUSCULAR | Status: AC
  Administered 2014-05-31: 18 [IU] via SUBCUTANEOUS
  Filled 2014-05-31: qty 1

## 2014-05-31 MED ORDER — SODIUM CHLORIDE 0.9 % IV BOLUS (SEPSIS)
1000.0000 mL | Freq: Once | INTRAVENOUS | Status: AC
  Administered 2014-05-31: 1000 mL via INTRAVENOUS

## 2014-05-31 NOTE — ED Notes (Signed)
CBG is 302 at 1020 AM. MD made aware

## 2014-05-31 NOTE — ED Notes (Signed)
Pt reports cough x 8 weeks, was treated for bronchitis a few weeks ago with no improvement in cough

## 2014-05-31 NOTE — ED Provider Notes (Signed)
CSN: [id number]     Arrival date & time 05/31/14  0630 History   First MD Initiated Contact with Patient 05/31/14 [phone]     Chief Complaint  Patient presents with  . Cough     (Consider location/radiation/quality/duration/timing/severity/associated sxs/prior Treatment) Patient is a 48 y.o. male presenting with cough. The history is provided by the patient. No language interpreter was used.  Cough Cough characteristics:  Non-productive Severity:  Moderate Duration:  7 weeks Progression:  Unchanged Chronicity:  New Smoker: no   Worsened by:  Deep breathing Ineffective treatments:  None tried Associated symptoms: rhinorrhea and sore throat   Associated symptoms: no chest pain, no chills, no fever, no headaches, no rash, no shortness of breath and no sinus congestion   Associated symptoms comment:  Post nasal gtt Sore throat:    Severity:  Mild   Duration: with cough.   Timing:  Intermittent   Progression:  Waxing and waning   Past Medical History  Diagnosis Date  . Diabetes mellitus without complication    Past Surgical History  Procedure Laterality Date  . Appendectomy     History reviewed. No pertinent family history. History  Substance Use Topics  . Smoking status: Current Every Day Smoker -- 0.50 packs/day    Types: Cigarettes  . Smokeless tobacco: Not on file  . Alcohol Use: No    Review of Systems  Constitutional: Negative for fever, chills, activity change, appetite change and fatigue.  HENT: Positive for rhinorrhea and sore throat. Negative for congestion, facial swelling and trouble swallowing.   Eyes: Negative for photophobia and pain.  Respiratory: Positive for cough. Negative for chest tightness and shortness of breath.   Cardiovascular: Negative for chest pain and leg swelling.  Gastrointestinal: Negative for nausea, vomiting, abdominal pain, diarrhea and constipation.  Endocrine: Negative for polydipsia and polyuria.  Genitourinary: Negative for  dysuria, urgency, decreased urine volume and difficulty urinating.  Musculoskeletal: Negative for back pain and gait problem.  Skin: Negative for color change, rash and wound.  Allergic/Immunologic: Negative for immunocompromised state.  Neurological: Negative for dizziness, facial asymmetry, speech difficulty, weakness, numbness and headaches.  Psychiatric/Behavioral: Negative for confusion, decreased concentration and agitation.      Allergies  Review of patient's allergies indicates no known allergies.  Home Medications   Prior to Admission medications   Medication Sig Start Date End Date Taking? Authorizing Provider  albuterol (PROVENTIL HFA;VENTOLIN HFA) 108 (90 BASE) MCG/ACT inhaler Inhale 1-2 puffs into the lungs every 6 (six) hours as needed for wheezing or shortness of breath. 05/31/14   Toy Cookey, MD  azithromycin (ZITHROMAX) 250 MG tablet Take 1 tablet (250 mg total) by mouth daily. Take first 2 tablets together, then 1 every day until finished. 05/03/14   Elson Areas, PA-C  benzonatate (TESSALON) 100 MG capsule Take 1 capsule (100 mg total) by mouth every 8 (eight) hours. 05/31/14   Toy Cookey, MD  HYDROcodone-homatropine (HYDROMET) 5-1.5 MG/5ML syrup Take 5 mLs by mouth every 6 (six) hours as needed for cough. 05/03/14   Elson Areas, PA-C  insulin NPH Human (HUMULIN N,NOVOLIN N) 100 UNIT/ML injection Inject into the skin.    Historical Provider, MD  METFORMIN HCL PO Take by mouth.    Historical Provider, MD   BP 121/74 mmHg  Pulse 71  Temp(Src) 98.1 F (36.7 C) (Oral)  Resp 18  Ht 5\' 11"  (1.803 m)  Wt 200 lb (90.719 kg)  BMI 27.91 kg/m2  SpO2 98% Physical Exam  Constitutional:  He is oriented to person, place, and time. He appears well-developed and well-nourished. No distress.  HENT:  Head: Normocephalic and atraumatic.  Mouth/Throat: No oropharyngeal exudate.  Eyes: Pupils are equal, round, and reactive to light.  Neck: Normal range of motion. Neck  supple.  Cardiovascular: Normal rate, regular rhythm and normal heart sounds.  Exam reveals no gallop and no friction rub.   No murmur heard. Pulmonary/Chest: Effort normal and breath sounds normal. No respiratory distress. He has no wheezes. He has no rales.  Abdominal: Soft. Bowel sounds are normal. He exhibits no distension and no mass. There is no tenderness. There is no rebound and no guarding.  Musculoskeletal: Normal range of motion. He exhibits no edema or tenderness.  Neurological: He is alert and oriented to person, place, and time.  Skin: Skin is warm and dry.  Psychiatric: He has a normal mood and affect.    ED Course  Procedures (including critical care time) Labs Review Labs Reviewed  CBG MONITORING, ED - Abnormal; Notable for the following:    Glucose-Capillary 408 (*)    All other components within normal limits  CBG MONITORING, ED - Abnormal; Notable for the following:    Glucose-Capillary 397 (*)    All other components within normal limits  CBG MONITORING, ED    Imaging Review Dg Chest 2 View  05/31/2014   CLINICAL DATA:  Eight week history of cough and congestion  EXAM: CHEST  2 VIEW  COMPARISON:  May 03, 2014  FINDINGS: Lungs are clear. Heart size and pulmonary vascularity are normal. No adenopathy. No bone lesions. There is mild upper lumbar levoscoliosis.  IMPRESSION: No edema or consolidation.   Electronically Signed   By: Bretta BangWilliam  Woodruff M.D.   On: 05/31/2014 07:17     EKG Interpretation None      MDM   Final diagnoses:  Cough    Pt is a 48 y.o. male with Pmhx as above who presents with about 7 weeks of cough.  He was seen here approximately 1 month ago for same.  He has had no fever, chills, myalgias, shortness of breath.  No sick contacts.  On physical exam he is well appearing, vital signs are normal.  Lungs are clear, HEENT exam is benign.  Suspect cough is, residual from recent URI. POC glu 408, does not seem compliant with monitoring and  I do not think he is a good candidate for prednisone.  He also has no signs or symptoms of DKA.  Glucose improved after his home dose of insulin and metformin as well as 1 L normal saline.  Patient will be discharged home with Tessalon, and albuterol PRN. Pt instructed to inc frequency of glucose testing on prednisone.  Referral given [facility]     Neta Ehlerslwin Doolin evaluation in the Emergency Department is complete. It has been determined that no acute conditions requiring further emergency intervention are present at this time. The patient/guardian have been advised of the diagnosis and plan. We have discussed signs and symptoms that warrant return to the ED, such as changes or worsening in symptoms, fever, chest pain, shortness of breath      Toy CookeyMegan Docherty, MD 05/31/14 1029

## 2014-05-31 NOTE — Discharge Instructions (Signed)
Cough, Adult   A cough is a reflex. It helps you clear your throat and airways. A cough can help heal your body. A cough can last 2 or 3 weeks (acute) or may last more than 8 weeks (chronic). Some common causes of a cough can include an infection, allergy, or a cold.  HOME CARE  · Only take medicine as told by your doctor.  · If given, take your medicines (antibiotics) as told. Finish them even if you start to feel better.  · Use a cold steam vaporizer or humidifier in your home. This can help loosen thick spit (secretions).  · Sleep so you are almost sitting up (semi-upright). Use pillows to do this. This helps reduce coughing.  · Rest as needed.  · Stop smoking if you smoke.  GET HELP RIGHT AWAY IF:  · You have yellowish-white fluid (pus) in your thick spit.  · Your cough gets worse.  · Your medicine does not reduce coughing, and you are losing sleep.  · You cough up blood.  · You have trouble breathing.  · Your pain gets worse and medicine does not help.  · You have a fever.  MAKE SURE YOU:   · Understand these instructions.  · Will watch your condition.  · Will get help right away if you are not doing well or get worse.  Document Released: 02/06/2011 Document Revised: 10/10/2013 Document Reviewed: 02/06/2011  [employer]® Patient Information ©2015 [employer]. This information is not intended to replace advice given to you by your health care provider. Make sure you discuss any questions you have with your health care provider.

## 2014-05-31 NOTE — ED Notes (Signed)
Pt c/o cough x 8 weeks  Was seen here and treated for bronchiolitis but cough has not gone away

## 2014-06-01 LAB — CBG MONITORING, ED: GLUCOSE-CAPILLARY: 302 mg/dL — AB (ref 70–99)

## 2014-08-28 ENCOUNTER — Emergency Department (HOSPITAL_BASED_OUTPATIENT_CLINIC_OR_DEPARTMENT_OTHER): Payer: 59

## 2014-08-28 ENCOUNTER — Emergency Department (HOSPITAL_BASED_OUTPATIENT_CLINIC_OR_DEPARTMENT_OTHER)
Admission: EM | Admit: 2014-08-28 | Discharge: 2014-08-29 | Disposition: A | Discharge status: Home / Self Care | Payer: 59 | Attending: Emergency Medicine | Admitting: Emergency Medicine

## 2014-08-28 ENCOUNTER — Encounter (HOSPITAL_BASED_OUTPATIENT_CLINIC_OR_DEPARTMENT_OTHER): Payer: Self-pay | Admitting: Emergency Medicine

## 2014-08-28 DIAGNOSIS — Z9049 Acquired absence of other specified parts of digestive tract: Secondary | ICD-10-CM | POA: Insufficient documentation

## 2014-08-28 DIAGNOSIS — Z720 Tobacco use: Secondary | ICD-10-CM | POA: Diagnosis not present

## 2014-08-28 DIAGNOSIS — R42 Dizziness and giddiness: Secondary | ICD-10-CM | POA: Insufficient documentation

## 2014-08-28 DIAGNOSIS — E119 Type 2 diabetes mellitus without complications: Secondary | ICD-10-CM | POA: Diagnosis not present

## 2014-08-28 DIAGNOSIS — Z792 Long term (current) use of antibiotics: Secondary | ICD-10-CM | POA: Insufficient documentation

## 2014-08-28 DIAGNOSIS — R109 Unspecified abdominal pain: Secondary | ICD-10-CM | POA: Diagnosis present

## 2014-08-28 DIAGNOSIS — Z794 Long term (current) use of insulin: Secondary | ICD-10-CM | POA: Diagnosis not present

## 2014-08-28 DIAGNOSIS — Z79899 Other long term (current) drug therapy: Secondary | ICD-10-CM | POA: Diagnosis not present

## 2014-08-28 DIAGNOSIS — R1011 Right upper quadrant pain: Secondary | ICD-10-CM | POA: Insufficient documentation

## 2014-08-28 LAB — CBC WITH DIFFERENTIAL/PLATELET
Basophils Absolute: 0 10*3/uL (ref 0.0–0.1)
Basophils Relative: 0 % (ref 0–1)
Eosinophils Absolute: 0.2 10*3/uL (ref 0.0–0.7)
Eosinophils Relative: 3 % (ref 0–5)
HEMATOCRIT: 43.2 % (ref 39.0–52.0)
Hemoglobin: 14.5 g/dL (ref 13.0–17.0)
Lymphocytes Relative: 42 % (ref 12–46)
Lymphs Abs: 3.4 10*3/uL (ref 0.7–4.0)
MCH: 30 pg (ref 26.0–34.0)
MCHC: 33.6 g/dL (ref 30.0–36.0)
MCV: 89.3 fL (ref 78.0–100.0)
MONO ABS: 0.8 10*3/uL (ref 0.1–1.0)
MONOS PCT: 10 % (ref 3–12)
Neutro Abs: 3.6 10*3/uL (ref 1.7–7.7)
Neutrophils Relative %: 45 % (ref 43–77)
PLATELETS: 197 10*3/uL (ref 150–400)
RBC: 4.84 MIL/uL (ref 4.22–5.81)
RDW: 12.7 % (ref 11.5–15.5)
WBC: 8.1 10*3/uL (ref 4.0–10.5)

## 2014-08-28 LAB — URINALYSIS, ROUTINE W REFLEX MICROSCOPIC
Bilirubin Urine: NEGATIVE
Glucose, UA: >1000 mg/dL — AB
HGB URINE DIPSTICK: NEGATIVE
Ketones, ur: NEGATIVE mg/dL
Leukocytes, UA: NEGATIVE
NITRITE: NEGATIVE
Protein, ur: NEGATIVE mg/dL
Specific Gravity, Urine: 1.039 — ABNORMAL HIGH (ref 1.005–1.030)
UROBILINOGEN UA: 0.2 mg/dL (ref 0.0–1.0)
pH: 6 (ref 5.0–8.0)

## 2014-08-28 LAB — COMPREHENSIVE METABOLIC PANEL
ALT: 22 U/L (ref 0–53)
ANION GAP: 6 (ref 5–15)
AST: 17 U/L (ref 0–37)
Albumin: 3.9 g/dL (ref 3.5–5.2)
Alkaline Phosphatase: 75 U/L (ref 39–117)
BUN: 15 mg/dL (ref 6–23)
CALCIUM: 8.7 mg/dL (ref 8.4–10.5)
CO2: 28 mmol/L (ref 19–32)
Chloride: 104 mmol/L (ref 96–112)
Creatinine, Ser: 0.94 mg/dL (ref 0.50–1.35)
GFR calc Af Amer: >90 mL/min (ref >90)
Glucose, Bld: 279 mg/dL — ABNORMAL HIGH (ref 70–99)
Potassium: 3.7 mmol/L (ref 3.5–5.1)
SODIUM: 138 mmol/L (ref 135–145)
Total Bilirubin: 0.6 mg/dL (ref 0.3–1.2)
Total Protein: 6.3 g/dL (ref 6.0–8.3)

## 2014-08-28 LAB — LIPASE, BLOOD: LIPASE: 34 U/L (ref 11–59)

## 2014-08-28 LAB — URINE MICROSCOPIC-ADD ON
RBC / HPF: NONE SEEN RBC/hpf (ref <3)
WBC UA: NONE SEEN WBC/hpf (ref <3)

## 2014-08-28 MED ORDER — SODIUM CHLORIDE 0.9 % IV BOLUS (SEPSIS)
1000.0000 mL | Freq: Once | INTRAVENOUS | Status: AC
  Administered 2014-08-28: 1000 mL via INTRAVENOUS

## 2014-08-28 NOTE — ED Notes (Signed)
Patient transported to Ultrasound 

## 2014-08-28 NOTE — ED Notes (Signed)
Pt states he had right side pain started today and it made his vision blurry, pt states that has stopped.

## 2014-08-28 NOTE — ED Provider Notes (Signed)
CSN: [id number]     Arrival date & time 08/28/14  1857 History   First MD Initiated Contact with Patient 08/28/14 2110     Chief Complaint  Patient presents with  . Flank Pain     (Consider location/radiation/quality/duration/timing/severity/associated sxs/prior Treatment) HPI [name] is a 49 year old male with past medical history of diabetes, past surgical history of appendectomy 1 year ago who presents the ER complaining of right upper quadrant pain for the past year. Patient states his pain has radiated throughout his right side of his abdomen and then mild over the past year. He states his pain comes and goes, is typically present in the morning when he wakes up, and states he frequently notices his pain after he eats. Patient states he denies any alleviating factors of his pain, the pain ranges from a dull, mild sensation to today when he states the pain became sharp and unbearable. On interview and exam patient states his pain is tolerable again, however he is still feeling mild discomfort in his right upper quadrant. Patient denies associated nausea, vomiting, fever, diarrhea, dysuria, chest pain, shortness of breath. Patient reports one episode of associated lightheadedness today when his pain became severe. He states this episode was transient is not felt that since.  Past Medical History  Diagnosis Date  . Diabetes mellitus without complication    Past Surgical History  Procedure Laterality Date  . Appendectomy     History reviewed. No pertinent family history. History  Substance Use Topics  . Smoking status: Current Every Day Smoker -- 0.50 packs/day    Types: Cigarettes  . Smokeless tobacco: Not on file  . Alcohol Use: No    Review of Systems  Constitutional: Negative for fever.  HENT: Negative for trouble swallowing.   Eyes: Negative for visual disturbance.  Respiratory: Negative for shortness of breath.   Cardiovascular: Negative for chest pain.   Gastrointestinal: Positive for abdominal pain. Negative for nausea and vomiting.  Genitourinary: Negative for dysuria.  Musculoskeletal: Negative for neck pain.  Skin: Negative for rash.  Neurological: Positive for light-headedness. Negative for dizziness, weakness and numbness.  Psychiatric/Behavioral: Negative.       Allergies  Review of patient's allergies indicates no known allergies.  Home Medications   Prior to Admission medications   Medication Sig Start Date End Date Taking? Authorizing Provider  albuterol (PROVENTIL HFA;VENTOLIN HFA) 108 (90 BASE) MCG/ACT inhaler Inhale 1-2 puffs into the lungs every 6 (six) hours as needed for wheezing or shortness of breath. 05/31/14   Toy Cookey, MD  azithromycin (ZITHROMAX) 250 MG tablet Take 1 tablet (250 mg total) by mouth daily. Take first 2 tablets together, then 1 every day until finished. 05/03/14   Elson Areas, PA-C  benzonatate (TESSALON) 100 MG capsule Take 1 capsule (100 mg total) by mouth every 8 (eight) hours. 05/31/14   Toy Cookey, MD  HYDROcodone-homatropine (HYDROMET) 5-1.5 MG/5ML syrup Take 5 mLs by mouth every 6 (six) hours as needed for cough. 05/03/14   Elson Areas, PA-C  insulin NPH Human (HUMULIN N,NOVOLIN N) 100 UNIT/ML injection Inject into the skin.    Historical Provider, MD  METFORMIN HCL PO Take by mouth.    Historical Provider, MD   BP 116/68 mmHg  Pulse 52  Temp(Src) 98 F (36.7 C) (Oral)  Resp 16  Ht  (1.803 m)  Wt 205 lb (92.987 kg)  BMI 28.60 kg/m2  SpO2 100% Physical Exam  Constitutional: He is oriented to person, place, and  time. He appears well-developed and well-nourished. No distress.  HENT:  Head: Normocephalic and atraumatic.  Mouth/Throat: Oropharynx is clear and moist. No oropharyngeal exudate.  Eyes: Right eye exhibits no discharge. Left eye exhibits no discharge. No scleral icterus.  Neck: Normal range of motion.  Cardiovascular: Normal rate, regular rhythm and  normal heart sounds.   No murmur heard. Pulmonary/Chest: Effort normal and breath sounds normal. No respiratory distress.  Abdominal: Soft. There is tenderness in the right upper quadrant. There is no rigidity, no guarding, no tenderness at McBurney's point and negative Murphy's sign.  No Murphy sign, however palpating patient's right upper quadrant patient states this is where his pain has been originating, and this is where his pain has been severe also.  Musculoskeletal: Normal range of motion. He exhibits no edema or tenderness.  Neurological: He is alert and oriented to person, place, and time. No cranial nerve deficit. Coordination normal.  Skin: Skin is warm and dry. No rash noted. He is not diaphoretic.  Psychiatric: He has a normal mood and affect.  Nursing note and vitals reviewed.   ED Course  Procedures (including critical care time) Labs Review Labs Reviewed  URINALYSIS, ROUTINE W REFLEX MICROSCOPIC - Abnormal; Notable for the following:    Specific Gravity, Urine 1.039 (*)    Glucose, UA >1000 (*)    All other components within normal limits  COMPREHENSIVE METABOLIC PANEL - Abnormal; Notable for the following:    Glucose, Bld 279 (*)    All other components within normal limits  CBC WITH DIFFERENTIAL/PLATELET  LIPASE, BLOOD  URINE MICROSCOPIC-ADD ON    Imaging Review US Abdomen Complete  08/28/2014   CLINICAL DATA:  Right upper quadrant pain for 1 month, increased intensity today  EXAM: ULTRASOUND ABDOMEN COMPLETE  COMPARISON:  None.  FINDINGS: Gallbladder: No gallstones or wall thickening visualized. No sonographic Murphy sign noted.  Common bile duct: Diameter: 3 mm  Liver: No focal lesion identified. Within normal limits in parenchymal echogenicity.  IVC: No abnormality visualized.  Pancreas: Visualized portion unremarkable.  Spleen: Size and appearance within normal limits.  Right Kidney: Length: 12.0 cm. Echogenicity within normal limits. No mass or hydronephrosis  visualized.  Left Kidney: Length: 11.4 cm. Echogenicity within normal limits. No mass or hydronephrosis visualized.  Abdominal aorta: No aneurysm visualized.  Other findings: None.  IMPRESSION: Normal   Electronically Signed   By: Ellery Plunk M.D.   On: 08/28/2014 23:28     EKG Interpretation None      MDM   Final diagnoses:  Right upper quadrant pain    RUQ pain x1 year, worse over last 24 hrs.  Hx and PE consistent with possible gallbladder etiology.  Offered pt symptomatic therapy, however pt states his pain is mild on exam, and "I don't hurt that bad, I just want to know what this is".  Lab work does not show evidence of acute pathology or end-organ damage/injury.  Abdominal exam is non-concerning for surgical or acute abdomen.  Imaging with [place] abd complete with impression of a normal abdominal US exam.  Pt hemodynamically stable, afebrile, well-appearing and in no acute distress throughout ED stay.  Pt asleep on re-exam, and continues to have benign abd exam.  Pt d/c and recommended to f/u with GI for further w/u and eval.  Strict return precautions discussed.  Pt verbalized understanding and agreement of this plan.  Encouraged pt to call or return to the ER with worsening of s/s or with any questions or  concerns.    BP 116/68 mmHg  Pulse 52  Temp(Src) 98 F (36.7 C) (Oral)  Resp 16  Ht  (1.803 m)  Wt 205 lb (92.987 kg)  BMI 28.60 kg/m2  SpO2 100%  Signed,  Ladona Mow, PA-C 3:17 PM  Pt discussed with Dr. Mirian Mo, MD  Ladona Mow, PA-C 08/29/14 1517  Mirian Mo, MD 08/29/14 [phone]

## 2014-08-29 MED ORDER — HYDROCODONE-ACETAMINOPHEN 5-325 MG PO TABS: 1.0000 | ORAL_TABLET | Freq: Four times a day (QID) | ORAL | Status: DC | PRN

## 2014-08-29 NOTE — Discharge Instructions (Signed)
Abdominal Pain °Many things can cause abdominal pain. Usually, abdominal pain is not caused by a disease and will improve without treatment. It can often be observed and treated at home. Your health care provider will do a physical exam and possibly order blood tests and X-rays to help determine the seriousness of your pain. However, in many cases, more time must pass before a clear cause of the pain can be found. Before that point, your health care provider may not know if you need more testing or further treatment. °HOME CARE INSTRUCTIONS  °Monitor your abdominal pain for any changes. The following actions may help to alleviate any discomfort you are experiencing: °· Only take over-the-counter or prescription medicines as directed by your health care provider. °· Do not take laxatives unless directed to do so by your health care provider. °· Try a clear liquid diet (broth, tea, or water) as directed by your health care provider. Slowly move to a bland diet as tolerated. °SEEK MEDICAL CARE IF: °· You have unexplained abdominal pain. °· You have abdominal pain associated with nausea or diarrhea. °· You have pain when you urinate or have a bowel movement. °· You experience abdominal pain that wakes you in the night. °· You have abdominal pain that is worsened or improved by eating food. °· You have abdominal pain that is worsened with eating fatty foods. °· You have a fever. °SEEK IMMEDIATE MEDICAL CARE IF:  °· Your pain does not go away within 2 hours. °· You keep throwing up (vomiting). °· Your pain is felt only in portions of the abdomen, such as the right side or the left lower portion of the abdomen. °· You pass bloody or black tarry stools. °MAKE SURE YOU: °· Understand these instructions.   °· Will watch your condition.   °· Will get help right away if you are not doing well or get worse.   °Document Released: 03/05/2005 Document Revised: 05/31/2013 Document Reviewed: 02/02/2013 °ExitCare® Patient Information  ©2015 [employer]. This information is not intended to replace advice given to you by your health care provider. Make sure you discuss any questions you have with your health care provider. ° °Biliary Colic  °Biliary colic is a steady or irregular pain in the upper abdomen. It is usually under the right side of the rib cage. It happens when gallstones interfere with the normal flow of bile from the gallbladder. Bile is a liquid that helps to digest fats. Bile is made in the liver and stored in the gallbladder. When you eat a meal, bile passes from the gallbladder through the cystic duct and the common bile duct into the small intestine. There, it mixes with partially digested food. If a gallstone blocks either of these ducts, the normal flow of bile is blocked. The muscle cells in the bile duct contract forcefully to try to move the stone. This causes the pain of biliary colic.  °SYMPTOMS  °· A person with biliary colic usually complains of pain in the upper abdomen. This pain can be: °¨ In the center of the upper abdomen just below the breastbone. °¨ In the upper-right part of the abdomen, near the gallbladder and liver. °¨ Spread back toward the right shoulder blade. °· Nausea and vomiting. °· The pain usually occurs after eating. °· Biliary colic is usually triggered by the digestive system's demand for bile. The demand for bile is high after fatty meals. Symptoms can also occur when a person who has been fasting suddenly eats a very   large meal. Most episodes of biliary colic pass after 1 to 5 hours. After the most intense pain passes, your abdomen may continue to ache mildly for about 24 hours. °DIAGNOSIS  °After you describe your symptoms, your caregiver will perform a physical exam. He or she will pay attention to the upper right portion of your belly (abdomen). This is the area of your liver and gallbladder. An ultrasound will help your caregiver look for gallstones. Specialized scans of the gallbladder may  also be done. Blood tests may be done, especially if you have fever or if your pain persists. °PREVENTION  °Biliary colic can be prevented by controlling the risk factors for gallstones. Some of these risk factors, such as heredity, increasing age, and pregnancy are a normal part of life. Obesity and a high-fat diet are risk factors you can change through a healthy lifestyle. Women going through menopause who take hormone replacement therapy (estrogen) are also more likely to develop biliary colic. °TREATMENT  °· Pain medication may be prescribed. °· You may be encouraged to eat a fat-free diet. °· If the first episode of biliary colic is severe, or episodes of colic keep retuning, surgery to remove the gallbladder (cholecystectomy) is usually recommended. This procedure can be done through small incisions using an instrument called a laparoscope. The procedure often requires a brief stay in the hospital. Some people can leave the hospital the same day. It is the most widely used treatment in people troubled by painful gallstones. It is effective and safe, with no complications in more than 90% of cases. °· If surgery cannot be done, medication that dissolves gallstones may be used. This medication is expensive and can take months or years to work. Only small stones will dissolve. °· Rarely, medication to dissolve gallstones is combined with a procedure called shock-wave lithotripsy. This procedure uses carefully aimed shock waves to break up gallstones. In many people treated with this procedure, gallstones form again within a few years. °PROGNOSIS  °If gallstones block your cystic duct or common bile duct, you are at risk for repeated episodes of biliary colic. There is also a 25% chance that you will develop a gallbladder infection(acute cholecystitis), or some other complication of gallstones within 10 to 20 years. If you have surgery, schedule it at a time that is convenient for you and at a time when you are  not sick. °HOME CARE INSTRUCTIONS  °· Drink plenty of clear fluids. °· Avoid fatty, greasy or fried foods, or any foods that make your pain worse. °· Take medications as directed. °SEEK MEDICAL CARE IF:  °· You develop a fever over 100.5° F (38.1° C). °· Your pain gets worse over time. °· You develop nausea that prevents you from eating and drinking. °· You develop vomiting. °SEEK IMMEDIATE MEDICAL CARE IF:  °· You have continuous or severe belly (abdominal) pain which is not relieved with medications. °· You develop nausea and vomiting which is not relieved with medications. °· You have symptoms of biliary colic and you suddenly develop a fever and shaking chills. This may signal cholecystitis. Call your caregiver immediately. °· You develop a yellow color to your skin or the white part of your eyes (jaundice). °Document Released: 10/27/2005 Document Revised: 08/18/2011 Document Reviewed: 01/06/2008 °ExitCare® Patient Information ©2015 [employer]. This information is not intended to replace advice given to you by your health care provider. Make sure you discuss any questions you have with your health care provider. ° °Emergency Department Resource Guide °  1) Find a Doctor and Pay Out of Pocket °Although you won't have to find out who is covered by your insurance plan, it is a good idea to ask around and get recommendations. You will then need to call the office and see if the doctor you have chosen will accept you as a new patient and what types of options they offer for patients who are self-pay. Some doctors offer discounts or will set up payment plans for their patients who do not have insurance, but you will need to ask so you aren't surprised when you get to your appointment. ° °2) Contact Your Local Health Department °Not all health departments have doctors that can see patients for sick visits, but many do, so it is worth a call to see if yours does. If you don't know where your local health department is,  you can check in your phone book. The CDC also has a tool to help you locate your state's health department, and many state websites also have listings of all of their local health departments. ° °3) Find a Walk-in Clinic °If your illness is not likely to be very severe or complicated, you may want to try a walk in clinic. These are popping up all over the country in pharmacies, drugstores, and shopping centers. They're usually staffed by nurse practitioners or physician assistants that have been trained to treat common illnesses and complaints. They're usually fairly quick and inexpensive. However, if you have serious medical issues or chronic medical problems, these are probably not your best option. ° °No Primary Care Doctor: °- Call [facility] at  832-8000 - they can help you locate a primary care doctor that  accepts your insurance, provides certain services, etc. °- Physician Referral Service- 1-800-533-3463 ° °Chronic Pain Problems: °Organization         Address  Phone   Notes  °[facility]  (336) 297-2271 Patients need to be referred by their primary care doctor.  ° °Medication Assistance: °Organization         Address  Phone   Notes  °[place] [facility] [address] °[place], [place] [address] (336) 641-8030 --Must be a resident of [place] °-- Must have NO insurance coverage whatsoever (no Medicaid/ Medicare, etc.) °-- The pt. MUST have a primary care doctor that directs their care regularly and follows them in the community °  °[facility]  (866) 331-1348   °[employer]  (888) 892-1162   ° °Agencies that provide inexpensive medical care: °Organization         Address  Phone   Notes  °[facility]  (336) 832-8035   °[facility]    (336) 832-7272   °[facility] [address] °[place], [place] [address] (336) 832-4777   °[facility] [address], °[place] (336)  271-4999   °[facility]    (336) 373-0678   °[facility]    (336) 272-1050   °[facility] ° [address], [place] Phone:  (336) 832-4444, Fax:  (336) 832-4440 Hours of Operation:  9 am - 6 pm, M-F.  Also accepts Medicaid/Medicare and self-pay.  °[facility] ° [address],  Phone: (336) [phone], Fax: (336) [phone]. Hours of Operation:  8:30 am - 5:30 pm, M-F.  Also accepts Medicaid and self-pay.  °[facility] [place] [address], [place] Phone: (336) 878-6027   °[facility] [address], [place], [place] (336)723-1848, Ext. 123 Mondays & Thursdays: 7-9 AM.  First 15 patients are seen on a first come, first serve basis. °  ° °Medicaid-accepting [place] Providers: ° °Organization         Address  Phone   Notes  °[facility] [address], [place] (336) 641-2100 Also accepts self-pay patients.  °[facility] [address], [place] ° (336) 856-9996   °[facility] [address], [place] (336) 288-8857   °[facility] [address] [place] [address], [place] (336) 299-7000   °Veita Bland [address], [place]  ° (336) 373-1557 Only accepts [place] Access Medicaid patients after they have their name applied to their card.  ° °Self-Pay (no insurance) in [place]: ° °Organization         Address  Phone   Notes  °Sickle Cell Patients, [facility] [address], [place] (336) 832-1970   °[facility] [address], [place] (336) 832-4400   ° [facility] ° [address], [place] (336) 992-4800   °[facility]/Dr. Osei-Bonsu ° [address] [place] [address], [place] or [address], [place] (336) [phone] Phone number for both [place] and [place] locations  is the same.  °[facility] [address], [place] (336) 299-0000   °[facility] [address] [place] [address], [place] or [address] (336) 852-7530 °(336) 878-2260   °[facility] [address], [place] (336) 350-1642, phone; (336) 294-5005, fax Sees patients 1st and 3rd Saturday of every month.  Must not qualify for public or private insurance (i.e. Medicaid, Medicare, [place] Health Choice, Veterans' Benefits) • Household income should be no more than 200% of the poverty level •The clinic cannot treat you if you are pregnant or think you are pregnant • Sexually transmitted diseases are not treated at the clinic.  ° ° °Dental Care: °Organization         Address  Phone  Notes  °[facility] [address],  (336) 641-6152 Accepts children up to age 21 who are enrolled in Medicaid or [place] Health Choice; pregnant women with a Medicaid card; and children who have applied for Medicaid or [place] Health Choice, but were declined, whose parents can pay a reduced fee at time of service.  °[facility] [place]  [address], [place] (336) 641-7733 Accepts children up to age 21 who are enrolled in Medicaid or [place] Health Choice; pregnant women with a Medicaid card; and children who have applied for Medicaid or  Health Choice, but were declined, whose parents can pay a reduced fee at time of service.  °[facility] ° [address],  (336) 641-4533 Patients are seen by appointment only. Walk-ins are not accepted. [facility] will see patients 18 years of age and older. °Monday - Tuesday (8am-5pm) °Most Wednesdays (8:30-5pm) °$30 per visit, cash only  °[facility] ° [address], [place] (336) 641-4533 Patients are seen by appointment only. Walk-ins are not accepted. [facility] will see  patients 18 years of age and older. °One Wednesday Evening (Monthly:   Volunteer Based).  $30 per visit, cash only  °[facility]  (919) [phone] for adults; Children under age 4, call Graduate Pediatric Dentistry at ([phone]) [phone]. Children aged 4-14, please call ([phone]) [phone] to request a pediatric application. ° Dental services are provided in all areas of dental care including fillings, crowns and bridges, complete and partial dentures, implants, gum treatment, root canals, and extractions. Preventive care is also provided. Treatment is provided to both adults and children. °Patients are selected via a lottery and there is often a waiting list. °  °[facility] [address], °[place] ° (336) 763-8833 [email or web address] °  °[facility] [address], [place], [place] (336)723-1848, Ext. 123 Second and Fourth Thursday of each month, opens at 6:30 AM; Clinic ends at 9 AM.  Patients are seen on a first-come first-served basis, and a limited number are seen during each clinic.  ° °[facility] ° [address], [place], [place] (336) 723-7904   Eligibility Requirements °You must have lived in [place], [place], or [place] counties for at least the last three months. °  You cannot be eligible for state or federal sponsored healthcare insurance, including Veterans Administration, Medicaid, or Medicare. °  You generally cannot be eligible for healthcare insurance through your employer.  °  How to apply: °Eligibility screenings are held every Tuesday and Wednesday afternoon from 1:00 pm until 4:00 pm. You do not need an appointment for the interview!  °[facility] [address], [place], [place] 336-631-2330   °[facility]  336-342-8273   °[facility]  336-703-3100   °[facility]  336-570-6415   ° °Behavioral Health Resources in the Community: °Intensive Outpatient  Programs °Organization         Address  Phone  Notes  °[facility] [address], [place], [place] 336-878-6098   °[facility] [address], [place], [place] 336-832-9800   °[facility] [address], [place], [place] ° 336-882-2125   °[facility] [address],  °[place], [place] 1-800-853-5163 or 336-641-4981   °Substance Abuse Resources °Organization         Address  Phone  Notes  °[facility]  336-882-2125   °[facility]  336-784-9470   °[facility]  336-285-9073   °[facility]  336-845-3988   °Residential & Outpatient Substance Abuse Program  1-800-659-3381   °Psychological Services °Organization         Address  Phone  Notes  °[facility]  336- 832-9600   °[facility]  336- 378-7881   °[facility] [address], [place] 1-800-853-5163 or 336-641-4981   ° °Mobile Crisis Teams °Organization         Address  Phone  Notes  °[facility], Mobile Crisis Care Unit  1-877-626-1772   °Assertive °[facility] ° [address] [place], [place] 336-834-9664   °Sharon DeEsch [address] °[place] 336-554-5454   ° °Self-Help/Support Groups °Organization         Address  Phone             Notes  °[facility] - variety of support groups  336- 373-1402 Call for more information  °Narcotics Anonymous (NA), [facility] [address], °[place]  2 meetings at this location  ° °Residential Treatment   Programs °Organization         Address  Phone  Notes  °[facility] [address],    °[place]  1-866-801-8205   °[facility] ° [address], [place], [place] 704-293-8524   °[facility] [address], [place] 336-845-3988 Admissions: 8am-3pm M-F  °[facility] [address]    °[place], [place]  336-841-1104   °[facility] [address], [place], [place] 336-379-7146   °[facility] [address]  °[place], [place] 336-285-9073   °[facility] Intensive Outpatient [address], [place], [place] 336-852-3033   °[facility] [address]  °[place], [place] 1-877-615-2722 or 336-784-9470   °[facility] [address] [place], [place] 336-227-7417 Accepts Medicaid  °[facility] [address]  ° [place] 1-800-659-3381 Substance Abuse/Addiction Treatment  ° °[place] Behavioral Health Resources °Organization         Address  Phone  Notes  °[facility]  (888) 581-9988   °Julie Brannon, PhD [address] [place], [place]   (336) 349-5553 or (336) 951-0000   °[facility]   [address] °[place], [place] (336) 349-4454   °[facility] [address], [place], [place] (336) 342-8316 Insurance/Medicaid/sponsorship through [facility]  °[facility] [address]                                    [place], [place] (336) 342-8316 Therapy/tele-psych/case  °[facility] [address]  ° [place], [place] (336) 349-2233    °Dr. Arfeen  (336) 349-4544   °[facility] [place]  [employer] [place] [facility] 1) [address], [place] °2) [address], [place] °3)  [address], [place] (336) 349-3220 °(336) 342-7768 ° °(336) 342-8140   °[place] [facility] (336) 342-1394 or (336) 342-3537 (After Hours)    ° ° ° °

## 2014-09-09 ENCOUNTER — Ambulatory Visit: Payer: Self-pay

## 2014-10-14 ENCOUNTER — Encounter: Payer: 59 | Attending: "Endocrinology

## 2014-10-14 VITALS — Ht 70.5 in | Wt 202.1 lb

## 2014-10-14 DIAGNOSIS — E119 Type 2 diabetes mellitus without complications: Secondary | ICD-10-CM | POA: Insufficient documentation

## 2014-10-14 DIAGNOSIS — Z713 Dietary counseling and surveillance: Secondary | ICD-10-CM | POA: Diagnosis not present

## 2014-10-14 DIAGNOSIS — Z794 Long term (current) use of insulin: Secondary | ICD-10-CM | POA: Diagnosis not present

## 2014-10-17 NOTE — Progress Notes (Signed)
Patient was seen on 10/14/14 for the complete diabetes self-management series at [facility]. This is a part of the [facility] [employer].  Handouts given during class include:  Living Well with Diabetes book  Carb Counting and Meal Planning book  Meal Plan Card  Carbohydrate guide  Meal planning worksheet  Low Sodium Flavoring Tips  The diabetes portion plate  Low Carbohydrate Snack Suggestions  A1c to eAG Conversion Chart  Diabetes Medications  Stress Management  Diabetes Recommended Care Schedule  Diabetes Success Plan  Core Class Satisfaction Survey  The following learning objectives were met by the patient during this course:  Describe diabetes  State some common risk factors for diabetes  Defines the role of glucose and insulin  Identifies type of diabetes and pathophysiology  Describe the relationship between diabetes and cardiovascular risk  State the members of the Healthcare Team  States the rationale for glucose monitoring  State when to test glucose  State their individual Target Range  State the importance of logging glucose readings  Describe how to interpret glucose readings  Identifies A1C target  Explain the correlation between A1c and eAG values  State symptoms and treatment of high blood glucose  State symptoms and treatment of low blood glucose  Explain proper technique for glucose testing  Identifies proper sharps disposal  Describe the role of different macronutrients on glucose  Explain how carbohydrates affect blood glucose  State what foods contain the most carbohydrates  Demonstrate carbohydrate counting  Demonstrate how to read Nutrition Facts food label  Describe effects of various fats on heart health  Describe the importance of good nutrition for health and healthy eating strategies  Describe techniques for managing your shopping, cooking and meal planning  List  strategies to follow meal plan when dining out  Describe the effects of alcohol on glucose and how to use it safely . State the amount of activity recommended for healthy living . Describe activities suitable for individual needs . Identify ways to regularly incorporate activity into daily life . Identify barriers to activity and ways to over come these barriers  Identify diabetes medications being personally used and their primary action for lowering glucose and possible side effects . Describe role of stress on blood glucose and develop strategies to address psychosocial issues . Identify diabetes complications and ways to prevent them  Explain how to manage diabetes during illness . Evaluate success in meeting personal goal . Establish 2-3 goals that they will plan to diligently work on until they return for the  53-monthfollow-up visit  Goals:   I will count my carb choices at most meals and snacks  I will be active 30 minutes or more 5 times a week  I will take my diabetes medications as scheduled  I will eat less unhealthy fats by eating less   I will test my glucose at least 2 times a day, 7 days a week  Keep a record of my meals  To help manage stress I will  Work out/run at least 3 times a week  Your patient has identified these potential barriers to change:  Motivation Time  Your patient has identified their diabetes self-care support plan as  Family Support  Plan: Follow up with [facility]

## 2014-10-30 ENCOUNTER — Other Ambulatory Visit: Payer: Self-pay

## 2014-10-30 VITALS — BP 118/74 | HR 73 | Ht 70.0 in | Wt 203.0 lb

## 2014-10-30 DIAGNOSIS — E119 Type 2 diabetes mellitus without complications: Secondary | ICD-10-CM | POA: Insufficient documentation

## 2014-10-30 LAB — POCT GLYCOSYLATED HEMOGLOBIN (HGB A1C): Hemoglobin A1C: 12.1

## 2014-10-30 NOTE — Patient Outreach (Signed)
[facility]) Care Management   10/30/2014  Rajvir Byrdsong Oct 19, 1965 [id number]  Manolis Sosinski is an 49 y.o. male.  Member seen for initial office visit for Link to Wellness program for self management of Type 2 diabetes  Subjective: member states that he has gone several years without regular insurance and he has not been able to seek close management of his diabetes.  States that now that he is working at [employer] he plans to start taking care of his health.  States that he went to the diabetes class a few weeks ago and he learned a lot.  States he saw a new primary care provider last month but he has not followed up with her about his lab results.  States they are referring him to an endocrinologist.  States has not had an eye exam in about 5 years.  States he has been buying his insulin and test strips at [facility].  Objective:   ROS  Physical Exam  Filed Vitals:   10/30/14 1334  BP: 118/74  Pulse: 73   Filed Weights   10/30/14 1334  Weight: 203 lb (92.08 kg)    Current Medications:   Current Outpatient Prescriptions  Medication Sig Dispense Refill  . insulin NPH Human (HUMULIN N,NOVOLIN N) 100 UNIT/ML injection Inject 18 Units into the skin 2 (two) times daily before a meal. 18 units in AM and 15 units PM    . albuterol (PROVENTIL HFA;VENTOLIN HFA) 108 (90 BASE) MCG/ACT inhaler Inhale 1-2 puffs into the lungs every 6 (six) hours as needed for wheezing or shortness of breath. (Patient not taking: Reported on 10/30/2014) 1 Inhaler 0  . aspirin EC 81 MG tablet Take 81 mg by mouth daily.    Marland Kitchen azithromycin (ZITHROMAX) 250 MG tablet Take 1 tablet (250 mg total) by mouth daily. Take first 2 tablets together, then 1 every day until finished. (Patient not taking: Reported on 10/30/2014) 6 tablet 0  . benzonatate (TESSALON) 100 MG capsule Take 1 capsule (100 mg total) by mouth every 8 (eight) hours. (Patient not taking: Reported on 10/30/2014) 21 capsule 0  .  HYDROcodone-homatropine (HYDROMET) 5-1.5 MG/5ML syrup Take 5 mLs by mouth every 6 (six) hours as needed for cough. (Patient not taking: Reported on 10/30/2014) 120 mL 0  . METFORMIN HCL PO Take by mouth.     No current facility-administered medications for this visit.    Functional Status:   In your present state of health, do you have any difficulty performing the following activities: 10/30/2014  Hearing? N  Vision? N  Difficulty concentrating or making decisions? N  Walking or climbing stairs? N  Dressing or bathing? N  Doing errands, shopping? N    Fall/Depression Screening:    PHQ 2/9 Scores 10/30/2014 10/17/2014  PHQ - 2 Score 0 0   THN CM Care Plan Problem One        Patient Outreach from 10/30/2014 in [facility] [employer]   Care Plan Problem One  Elevated blood sugars as evidenced by hemoglobin A1C of 12.1 related to dx of Type 2 DM   Care Plan for Problem One  Active   THN Long Term Goal (31-90 days)  Member will lower hemoglobin A1C by 2 points within the next 90 days   THN Long Term Goal Start Date  10/30/14   Interventions for Problem One Long Term Goal  Instructed on Link to [employer] and program requirements, Instructed he can now get his diabetic medications and testing  supplies , Issued True Metrix glucometer and demonstrated on use, Instructed member to call provider to get RX for insulin, strips and supplies, Instructed on CHO counting and portion control, given handout on the [employer] program and  encouraged to stop smokiing   THN CM Short Term Goal #2 (0-30 days)  Member will call to schedule eye exam within the next 30 days   THN CM Short Term Goal #2 Start Date  10/30/14   Interventions for Short Term Goal #2  Instructed on importance of regular eye exams and  how to call eye doctors to see if they are in -network      Assessment:  New member seen for initial visit for Link to Wellness for self management of Type 2 DM.  Member has not been able to seek  regular care for his diabetes in the past few years due to lack of resources an insurance.  He needs assistance navigating the health care system.  Member is motivated to start making changes in lifestyle and adhere to plan of care.  Member attended [facility] but he continues to need reinforcement of skills taught in class.  Plan:  Plan to eat 45-60 GM (3-4) servings of carbohydrate a meal and 15 GM for snacks. Plan to check blood sugar twice a day either fasting or 1 -2hrs after a meal.  Goals of 80-130 and after meals 180 or less. Plan to walk 30 minutes a day Plan to call MD to follow up on your lab and to get prescription for True Metrix testing strips and supplies Plan to schedule eye appointment.  Call within the next month Plan to complete EMMI programs by 12/01/14 Plan to see Link to Wellness on December 07, 2014 at 1PM  Dudley Major RN, [facility] Care Management Coordinator-Link to Wellness [facility] Care Management [phone]

## 2014-10-30 NOTE — Patient Instructions (Signed)
Given Link to Wellness diabetes teaching packet  1. Plan to eat 45-60 GM (3-4) servings of carbohydrate a meal and 15 GM for snacks. 2. Plan to check blood sugar twice a day either fasting or 1 -2hrs after a meal.  Goals of 80-130 and after meals 180 or less. 3. Plan to walk 30 minutes a day 4. Plan to call MD to follow up on your lab and to get prescription for True Metrix testing strips and supplies 5. Plan to schedule eye appointment.  Call within the next month 6. Plan to complete EMMI programs by 12/01/14 7. Plan to see Link to Wellness on December 07, 2014 at [facility]

## 2014-11-01 DIAGNOSIS — E119 Type 2 diabetes mellitus without complications: Secondary | ICD-10-CM | POA: Insufficient documentation

## 2014-11-09 DIAGNOSIS — D751 Secondary polycythemia: Secondary | ICD-10-CM | POA: Insufficient documentation

## 2014-11-09 DIAGNOSIS — N521 Erectile dysfunction due to diseases classified elsewhere: Secondary | ICD-10-CM

## 2014-11-09 DIAGNOSIS — F17200 Nicotine dependence, unspecified, uncomplicated: Secondary | ICD-10-CM | POA: Insufficient documentation

## 2014-11-09 DIAGNOSIS — E1169 Type 2 diabetes mellitus with other specified complication: Secondary | ICD-10-CM | POA: Insufficient documentation

## 2014-11-17 ENCOUNTER — Other Ambulatory Visit (HOSPITAL_BASED_OUTPATIENT_CLINIC_OR_DEPARTMENT_OTHER): Payer: Self-pay | Admitting: Internal Medicine

## 2014-11-17 DIAGNOSIS — R1011 Right upper quadrant pain: Secondary | ICD-10-CM

## 2014-11-22 ENCOUNTER — Other Ambulatory Visit (HOSPITAL_BASED_OUTPATIENT_CLINIC_OR_DEPARTMENT_OTHER): Payer: Self-pay | Admitting: Internal Medicine

## 2014-11-22 ENCOUNTER — Ambulatory Visit (HOSPITAL_BASED_OUTPATIENT_CLINIC_OR_DEPARTMENT_OTHER)
Admission: RE | Admit: 2014-11-22 | Discharge: 2014-11-22 | Disposition: A | Discharge status: Home / Self Care | Payer: 59 | Source: Ambulatory Visit | Attending: Internal Medicine | Admitting: Internal Medicine

## 2014-11-22 DIAGNOSIS — R1011 Right upper quadrant pain: Secondary | ICD-10-CM

## 2014-12-04 DIAGNOSIS — E559 Vitamin D deficiency, unspecified: Secondary | ICD-10-CM | POA: Insufficient documentation

## 2014-12-07 ENCOUNTER — Ambulatory Visit: Payer: 59

## 2014-12-21 ENCOUNTER — Ambulatory Visit: Payer: 59 | Admitting: Physician Assistant

## 2015-01-08 ENCOUNTER — Other Ambulatory Visit: Payer: Self-pay

## 2015-01-08 VITALS — BP 130/86 | HR 83 | Resp 16 | Ht 70.0 in | Wt 207.0 lb

## 2015-01-08 DIAGNOSIS — E119 Type 2 diabetes mellitus without complications: Secondary | ICD-10-CM

## 2015-01-08 NOTE — Patient Outreach (Signed)
[facility]) Care Management   01/08/2015  [name] June 24, 1965 [id number]  [name] is an 49 y.o. male.   Member seen for follow up office visit for Link to Wellness program for self management of Type 2 diabetes  Subjective: member states that he went to see Dr.Doerr the endocrinologist in July.  States that she started him on Toujeo and he is to see her again on 02/14/15.  States that he has started to see his blood sugars go down since he started the Toujeo.  States that he has been trying to watch his portion sizes and eat less fried foods.  States he has not been exercising as much as he would like to as the weather is hot.    Objective:   Review of Systems  All other systems reviewed and are negative. Member did not bring his glucometer to visit  Physical Exam   Today's Vitals   01/08/15 1335  BP: 130/86  Pulse: 83  Resp: 16  Height: 1.778 m (5' 10" )  Weight: 207 lb (93.895 kg)  SpO2: 94%  PainSc: 0-No pain   Current Medications:   Current Outpatient Prescriptions  Medication Sig Dispense Refill  . aspirin EC 81 MG tablet Take 81 mg by mouth daily.    . Insulin Glargine (TOUJEO SOLOSTAR) 300 UNIT/ML SOPN Inject 50 Units into the skin daily.    . metFORMIN (GLUCOPHAGE) 500 MG tablet Take 500 mg by mouth 2 (two) times daily with a meal.    . nicotine (RA NICOTINE) 14 mg/24hr patch Place 1 patch onto the skin daily.    . sildenafil (VIAGRA) 100 MG tablet Take 100 mg by mouth once as needed.    Marland Kitchen albuterol (PROVENTIL HFA;VENTOLIN HFA) 108 (90 BASE) MCG/ACT inhaler Inhale 1-2 puffs into the lungs every 6 (six) hours as needed for wheezing or shortness of breath. (Patient not taking: Reported on 10/30/2014) 1 Inhaler 0  . azithromycin (ZITHROMAX) 250 MG tablet Take 1 tablet (250 mg total) by mouth daily. Take first 2 tablets together, then 1 every day until finished. (Patient not taking: Reported on 10/30/2014) 6 tablet 0  . benzonatate  (TESSALON) 100 MG capsule Take 1 capsule (100 mg total) by mouth every 8 (eight) hours. (Patient not taking: Reported on 10/30/2014) 21 capsule 0  . HYDROcodone-homatropine (HYDROMET) 5-1.5 MG/5ML syrup Take 5 mLs by mouth every 6 (six) hours as needed for cough. (Patient not taking: Reported on 10/30/2014) 120 mL 0  . insulin NPH Human (HUMULIN N,NOVOLIN N) 100 UNIT/ML injection Inject 18 Units into the skin 2 (two) times daily before a meal. Not taking     No current facility-administered medications for this visit.    Functional Status:   In your present state of health, do you have any difficulty performing the following activities: 01/08/2015 10/30/2014  Hearing? N N  Vision? N N  Difficulty concentrating or making decisions? N N  Walking or climbing stairs? N N  Dressing or bathing? N N  Doing errands, shopping? N N    Fall/Depression Screening:    PHQ 2/9 Scores 01/08/2015 10/30/2014 10/17/2014  PHQ - 2 Score 0 0 0   THN CM Care Plan Problem One        Patient Outreach from 01/08/2015 in [place] Problem One  Elevated blood sugars as evidenced by hemoglobin A1C of 12.1 related to dx of Type 2 DM   Care Plan for Problem One  Active   THN Long Term Goal (31-90 days)  Member will lower hemoglobin A1C by 2 points within the next 90 days   THN Long Term Goal Start Date  01/08/15 [name] to have hemoglobin A1C checked 02/14/15]   Interventions for Problem One Long Term Goal   Reinforced on CHO counting and portion control,Instructed to start using nicotine patches and  encouraged to stop smoking,  Instructed on importance of exercise for glycemic control, Instructed how elevated blood sugars can effect his vision and encouraged to keep eye appointment on 01/19/15   THN CM Short Term Goal #2 (0-30 days)  Member will call to schedule eye exam within the next 30 days   THN CM Short Term Goal #2 Start Date  10/30/14   [facility] CM Short Term Goal #2 Met Date  01/08/15 [Appointment  scheduled for 01/19/15]      Assessment:   Member seen for follow up office visit for Link to Wellness program for self management of Type 2 diabetes.  Member has seen endocrinologist and he will return to have hemoglobin A1C  Checked at 02/14/15 appointment. Member reports lower CBGs ranging from 195-265 in AM since starting Toujeo.  Reports he has been monitoring portion sizes better.  He has not been exercising regularly.  Plan:  Plan to eat 45-60 GM (3-4) servings of carbohydrate a meal and 15 GM for snacks. Plan to check blood sugar once a day either fasting or 1 -2hrs after a meal.  Goals of 80-130 and after meals 180 or less. Plan to be active for  30 minutes every day  Plan to keep eye appointment on 01/19/15 Plan to complete EMMI programs by 03/10/15 Plan to keep appointment with Dr. Meredith Pel Plan to see Norm Parcel to Wellness on 03/27/15 Peter Garter RN, [facility] Care Management Coordinator-Link to [place] Management [phone]

## 2015-01-08 NOTE — Patient Instructions (Signed)
1. Plan to eat 45-60 GM (3-4) servings of carbohydrate a meal and 15 GM for snacks. 2. Plan to check blood sugar once a day either fasting or 1 -2hrs after a meal.  Goals of 80-130 and after meals 180 or less. 3. Plan to be active for  30 minutes every day  4. Plan to keep eye appointment on 01/19/15 5. Plan to complete EMMI programs by 03/10/15 6. Plan to keep appointment with Dr. Roanna Raider 7. Plan to see Link to Wellness on 03/27/15

## 2015-03-27 ENCOUNTER — Other Ambulatory Visit: Payer: 59

## 2015-03-27 NOTE — Patient Outreach (Signed)
[facility]) Care Management  03/27/2015  Lynda Capistran Swaminathan 1966/02/13 [id number]   Member did not show for scheduled 03/27/15 appointment.  Missed appointment letter sent. Dudley Major RN, [facility] Care Management Coordinator-Link to Wellness [facility] Care Management [phone]

## 2015-04-17 ENCOUNTER — Other Ambulatory Visit: Payer: Self-pay

## 2015-04-17 DIAGNOSIS — Z794 Long term (current) use of insulin: Principal | ICD-10-CM

## 2015-04-17 DIAGNOSIS — E119 Type 2 diabetes mellitus without complications: Secondary | ICD-10-CM

## 2015-04-17 NOTE — Patient Outreach (Signed)
[facility]) Care Management  04/17/2015  Schyler Counsell Klahr 1966-03-11 [id number]  Case closed as member has not met attendance policy.  Member last seen on 01/08/15 and did not show for 03/27/15 appointment.  Member did not respond to missed appointment letter.  Member has withdrawn from the program. Termination letter sent. Peter Garter RN, [facility] Care Management Coordinator-Link to [place] Management [phone]

## 2015-06-01 ENCOUNTER — Telehealth: Payer: 59 | Admitting: Internal Medicine

## 2015-06-01 DIAGNOSIS — R05 Cough: Secondary | ICD-10-CM

## 2015-06-01 DIAGNOSIS — R059 Cough, unspecified: Secondary | ICD-10-CM

## 2015-06-01 NOTE — Progress Notes (Signed)
Based on what you shared with me it looks like you have a serious condition that should be evaluated in a face to face office visit.  You will need a face to face visit with a provider since you have a cough with a fever.  If you are having a true medical emergency please call 911.  If you need an urgent face to face visit, [place] has four urgent care centers for your convenience.  [facility]  [phone] Get Driving Directions Find a Provider at this Location  [address] [place], [place] [address] . 8 am to 8 pm Monday-Friday . 9 am to 7 pm Saturday-Sunday  . [facility]  [phone] Get Driving Directions Find a Provider at this Location  [address] [place], [place] [address] . 8 am to 8 pm Monday-Friday . 9 am to 6 pm Saturday . 11 am to 6 pm Sunday   . [facility] at [facility]  [phone] Get Driving Directions  [address] [place], [place] [address] . 8 am to 8 pm Monday-Friday . 9 am to 4 pm Saturday-Sunday   . [facility] (a walk in primary care provider)  [phone]  Get Driving Directions Find a Provider at this Location  [address] [place], [place] [address] . 8 am to 8:30 pm Monday-Thursday . 8 am to 6 pm Friday . 8 am to 4 pm Saturday-Sunday   Your e-visit answers were reviewed by a board certified advanced clinical practitioner to complete your personal care plan.  Thank you for using e-Visits.

## 2015-06-10 ENCOUNTER — Encounter: Payer: Self-pay | Admitting: Family

## 2015-06-10 ENCOUNTER — Telehealth: Payer: 59 | Admitting: Family

## 2015-06-10 DIAGNOSIS — R05 Cough: Secondary | ICD-10-CM

## 2015-06-10 DIAGNOSIS — R059 Cough, unspecified: Secondary | ICD-10-CM

## 2015-06-10 MED ORDER — BENZONATATE 100 MG PO CAPS: 100.0000 mg | ORAL_CAPSULE | Freq: Three times a day (TID) | ORAL | Status: DC | PRN

## 2015-06-10 NOTE — Progress Notes (Signed)
We are sorry that you are not feeling well.  Here is how we plan to help!  Based on what you have shared with me it looks like you have upper respiratory tract inflammation that has resulted in a significant cough.  Inflammation and infection in the upper respiratory tract is commonly called bronchitis and has four common causes:  Allergies, Viral Infections, Acid Reflux and Bacterial Infections.  Allergies, viruses and acid reflux are treated by controlling symptoms or eliminating the cause. An example might be a cough caused by taking certain blood pressure medications. You stop the cough by changing the medication. Another example might be a cough caused by acid reflux. Controlling the reflux helps control the cough.  Based on your presentation I believe you most likely have A cough due to a virus.  This is called viral bronchitis and is best treated by rest, plenty of fluids and control of the cough.  You may use Ibuprofen or Tylenol as directed to help your symptoms.    In addition you may use A non-prescription cough medication called Mucinex DM: take 2 tablets every 12 hours. and A prescription cough medication called Tessalon Perles 100mg. You may take 1-2 capsules every 8 hours as needed for your cough.    HOME CARE . Only take medications as instructed by your medical team. . Complete the entire course of an antibiotic. . Drink plenty of fluids and get plenty of rest. . Avoid close contacts especially the very young and the elderly . Cover your mouth if you cough or cough into your sleeve. . Always remember to wash your hands . A steam or ultrasonic humidifier can help congestion.    GET HELP RIGHT AWAY IF: . You develop worsening fever. . You become short of breath . You cough up blood. . Your symptoms persist after you have completed your treatment plan MAKE SURE YOU   Understand these instructions.  Will watch your condition.  Will get help right away if you are not doing  well or get worse.  Your e-visit answers were reviewed by a board certified advanced clinical practitioner to complete your personal care plan.  Depending on the condition, your plan could have included both over the counter or prescription medications. If there is a problem please reply  once you have received a response from your provider. Your safety is important to us.  If you have drug allergies check your prescription carefully.    You can use MyChart to ask questions about today's visit, request a non-urgent call back, or ask for a work or school excuse for 24 hours related to this e-Visit. If it has been greater than 24 hours you will need to follow up with your provider, or enter a new e-Visit to address those concerns. You will get an e-mail in the next two days asking about your experience.  I hope that your e-visit has been valuable and will speed your recovery. Thank you for using e-visits.   

## 2015-06-21 MED FILL — TOUJEO SOLOSTAR 300 UNITS/M: 300 | 30 days supply | Qty: 5 | Fill #3

## 2015-06-21 MED FILL — TRULICITY 1.5 MG/0.5 ML PEN: 1.5 | 30 days supply | Qty: 2 | Fill #3

## 2015-06-21 MED FILL — BD PEN NDL MINI 31GX5MM: 31G X 5 MM | 90 days supply | Qty: 100 | Fill #2

## 2015-07-04 ENCOUNTER — Ambulatory Visit (INDEPENDENT_AMBULATORY_CARE_PROVIDER_SITE_OTHER): Payer: 59 | Admitting: Medical

## 2015-07-04 ENCOUNTER — Encounter: Payer: Self-pay | Admitting: Medical

## 2015-07-04 VITALS — BP 120/80 | HR 78 | Temp 98.1°F | Ht 70.75 in | Wt 208.0 lb

## 2015-07-04 DIAGNOSIS — Z720 Tobacco use: Secondary | ICD-10-CM

## 2015-07-04 DIAGNOSIS — F17200 Nicotine dependence, unspecified, uncomplicated: Secondary | ICD-10-CM

## 2015-07-04 DIAGNOSIS — Z794 Long term (current) use of insulin: Secondary | ICD-10-CM

## 2015-07-04 DIAGNOSIS — E119 Type 2 diabetes mellitus without complications: Secondary | ICD-10-CM | POA: Diagnosis not present

## 2015-07-04 DIAGNOSIS — J320 Chronic maxillary sinusitis: Secondary | ICD-10-CM | POA: Diagnosis not present

## 2015-07-04 MED ORDER — FLUTICASONE PROPIONATE 50 MCG/ACT NA SUSP: 2.0000 | Freq: Every day | NASAL | Status: DC

## 2015-07-04 MED ORDER — AZITHROMYCIN 250 MG PO TABS: ORAL_TABLET | ORAL | Status: DC

## 2015-07-04 MED ORDER — HYDROCODONE-HOMATROPINE 5-1.5 MG/5ML PO SYRP: 5.0000 mL | ORAL_SOLUTION | Freq: Three times a day (TID) | ORAL | Status: DC | PRN

## 2015-07-04 MED ORDER — NICOTINE 14 MG/24HR TD PT24: 14.0000 mg | MEDICATED_PATCH | Freq: Every day | TRANSDERMAL | Status: DC

## 2015-07-04 MED FILL — AZITHROMYCIN 250 MG TABLET: 250 | 5 days supply | Qty: 6 | Fill #0

## 2015-07-04 MED FILL — FLUTICASONE PROP 50 MCG SPR: 50 | 30 days supply | Qty: 16 | Fill #0

## 2015-07-04 MED FILL — HYDROCODONE-HOMATROPINE SYR: 5-1.5 | 8 days supply | Qty: 120 | Fill #0

## 2015-07-04 NOTE — Progress Notes (Signed)
Pre visit review using our clinic review tool, if applicable. No additional management support is needed unless otherwise documented below in the visit note. 

## 2015-07-04 NOTE — Assessment & Plan Note (Signed)
Pt sees endocrinologist and will continue current plan. Next a1-c in march when sees specialist.

## 2015-07-04 NOTE — Assessment & Plan Note (Signed)
Pt trying to cut back and will refill his nicotine patch as he tapers down.

## 2015-07-04 NOTE — Patient Instructions (Addendum)
You appear to have a sinus infection. I am prescribing azithromycin antibiotic for the infection. To help with the nasal congestion I prescribed flonase  nasal steroid. For your associated cough, I prescribed hycodan  Considering you may have some underlying allergies. And slight possible flu syndrome over weekend when muscles were sore. But you had flu vaccine and past treatment time window.   Rest, hydrate, tylenol for fever.  Follow up in 7 days or as needed.  In one month or so schedule CPE and come in fasting.

## 2015-07-04 NOTE — Progress Notes (Signed)
Subjective:    Patient ID: [name], male    DOB: [birth year], 50 y.o.   MRN: [id number]  HPI  I have reviewed pt PMH, PSH, FH, Social History and Surgical History  Pt in stating for past 3 wks feels congested on and off. Since Saturday has gotten worse. Pt congestion is worse, sinus pressure. Feels some chest congestion. Pt has used some afrin. Used last 3 days in a row.  Some sneezing and itching eyes early on.  Pt thinks some body aches during weekend but that has eased up some. He speculated flu like. He did have flu vaccine.  No wheezing.  No allergic  rhinitis in past but past 2 years he thinks maybe some mild symptoms of nasal congestion during season changes.  Pt diabetic- pt sees [employer]. Pt 6 months ago a1-c was 11. Pt is overdue to see endocrinolgoist in march.  Pt is a smoker- Has used nicotine patches. Pt smoking half pack a day.  Pt has erectile dysfunction in past. Hx of viagra use.     Review of Systems  Constitutional: Negative for fever, chills and fatigue.  HENT: Positive for congestion and sneezing.   Respiratory: Positive for cough. Negative for chest tightness, shortness of breath and wheezing.   Cardiovascular: Negative for chest pain and palpitations.  Genitourinary:       ED  Musculoskeletal: Negative for back pain.  Neurological: Negative for dizziness and headaches.  Hematological: Negative for adenopathy. Does not bruise/bleed easily.  Psychiatric/Behavioral: Negative for confusion.    Past Medical History  Diagnosis Date  . Diabetes mellitus without complication [facility])     Social History   Social History  . Marital Status: Married    Spouse Name: N/A  . Number of Children: N/A  . Years of Education: N/A   Occupational History  . Not on file.   Social History Main Topics  . Smoking status: Current Every Day Smoker -- 1.00 packs/day for 30 years    Types: Cigarettes    Start date: 10/29/1984  . Smokeless tobacco:  Never Used  . Alcohol Use: No  . Drug Use: No  . Sexual Activity: Yes   Other Topics Concern  . Not on file   Social History Narrative    Past Surgical History  Procedure Laterality Date  . Appendectomy      Family History  Problem Relation Age of Onset  . Diabetes Maternal Grandmother   . Arthritis Maternal Grandmother   . Arthritis Maternal Grandfather   . Arthritis Paternal Grandmother   . Arthritis Paternal Grandfather     No Known Allergies  Current Outpatient Prescriptions on File Prior to Visit  Medication Sig Dispense Refill  . aspirin EC 81 MG tablet Take 81 mg by mouth daily. Reported on 07/04/2015    . Insulin Glargine (TOUJEO SOLOSTAR) 300 UNIT/ML SOPN Inject 50 Units into the skin daily.    . insulin NPH Human (HUMULIN N,NOVOLIN N) 100 UNIT/ML injection Inject 18 Units into the skin 2 (two) times daily before a meal. Not taking    . metFORMIN (GLUCOPHAGE) 500 MG tablet Take 500 mg by mouth 2 (two) times daily with a meal.    . nicotine (RA NICOTINE) 14 mg/24hr patch Place 1 patch onto the skin daily.    . sildenafil (VIAGRA) 100 MG tablet Take 100 mg by mouth once as needed.     No current facility-administered medications on file prior to visit.    BP  120/80 mmHg  Pulse 78  Temp(Src) 98.1 F (36.7 C) (Oral)  Ht 5' 10.75" (1.797 m)  Wt 208 lb (94.348 kg)  BMI 29.22 kg/m2  SpO2 98%       Objective:   Physical Exam   General  Mental Status - Alert. General Appearance - Well groomed. Not in acute distress.  Skin Rashes- No Rashes.  HEENT Head- Normal. Ear Auditory Canal - Left- Normal. Right - Normal.Tympanic Membrane- Left- Normal. Right- Normal. Eye Sclera/Conjunctiva- Left- Normal. Right- Normal. Nose & Sinuses Nasal Mucosa- Left-  Boggy and Congested. Right-  Boggy and  Congested.Bilateral maxillary and frontal sinus pressure. Mouth & Throat Lips: Upper Lip- Normal: no dryness, cracking, pallor, cyanosis, or vesicular eruption. Lower  Lip-Normal: no dryness, cracking, pallor, cyanosis or vesicular eruption. Buccal Mucosa- Bilateral- No Aphthous ulcers. Oropharynx- No Discharge or Erythema. Tonsils: Characteristics- Bilateral- No Erythema or Congestion. Size/Enlargement- Bilateral- No enlargement. Discharge- bilateral-None.  Neck Neck- Supple. No Masses.   Chest and Lung Exam Auscultation: Breath Sounds:-Clear even and unlabored.  Cardiovascular Auscultation:Rythm- Regular, rate and rhythm. Murmurs & Other Heart Sounds:Ausculatation of the heart reveal- No Murmurs.  Lymphatic Head & Neck General Head & Neck Lymphatics: Bilateral: Description- No Localized lymphadenopathy.      Assessment & Plan:  You appear to have a sinus infection. I am prescribing azithromycin antibiotic for the infection. To help with the nasal congestion I prescribed flonase  nasal steroid. For your associated cough, I prescribed hycodan  Considering you may have some underlying allergies. And slight possible flu syndrome over weekend when muscles were sore. But you had flu vaccine and past treatment time window.   Rest, hydrate, tylenol for fever.  Follow up in 7 days or as needed.  In one month or so schedule CPE and come in fasting

## 2015-07-19 MED FILL — TOUJEO SOLOSTAR 300 UNITS/M: 300 | 34 days supply | Qty: 5 | Fill #0

## 2015-07-19 MED FILL — TRULICITY 1.5 MG/0.5 ML PEN: 1.5 | 30 days supply | Qty: 2 | Fill #4

## 2015-08-13 DIAGNOSIS — E1165 Type 2 diabetes mellitus with hyperglycemia: Secondary | ICD-10-CM | POA: Diagnosis not present

## 2015-08-13 DIAGNOSIS — N521 Erectile dysfunction due to diseases classified elsewhere: Secondary | ICD-10-CM | POA: Diagnosis not present

## 2015-08-13 DIAGNOSIS — E538 Deficiency of other specified B group vitamins: Secondary | ICD-10-CM | POA: Diagnosis not present

## 2015-08-13 DIAGNOSIS — E559 Vitamin D deficiency, unspecified: Secondary | ICD-10-CM | POA: Diagnosis not present

## 2015-08-13 DIAGNOSIS — E1169 Type 2 diabetes mellitus with other specified complication: Secondary | ICD-10-CM | POA: Diagnosis not present

## 2015-08-13 DIAGNOSIS — Z794 Long term (current) use of insulin: Secondary | ICD-10-CM | POA: Diagnosis not present

## 2015-08-15 DIAGNOSIS — E559 Vitamin D deficiency, unspecified: Secondary | ICD-10-CM | POA: Diagnosis not present

## 2015-08-15 DIAGNOSIS — D751 Secondary polycythemia: Secondary | ICD-10-CM | POA: Diagnosis not present

## 2015-08-15 DIAGNOSIS — E1169 Type 2 diabetes mellitus with other specified complication: Secondary | ICD-10-CM | POA: Diagnosis not present

## 2015-08-15 DIAGNOSIS — E119 Type 2 diabetes mellitus without complications: Secondary | ICD-10-CM | POA: Diagnosis not present

## 2015-08-15 DIAGNOSIS — N521 Erectile dysfunction due to diseases classified elsewhere: Secondary | ICD-10-CM | POA: Diagnosis not present

## 2015-08-15 DIAGNOSIS — F17200 Nicotine dependence, unspecified, uncomplicated: Secondary | ICD-10-CM | POA: Diagnosis not present

## 2015-08-15 DIAGNOSIS — E291 Testicular hypofunction: Secondary | ICD-10-CM | POA: Diagnosis not present

## 2015-08-15 DIAGNOSIS — E538 Deficiency of other specified B group vitamins: Secondary | ICD-10-CM | POA: Diagnosis not present

## 2015-08-15 MED FILL — TRULICITY 1.5 MG/0.5 ML PEN: 1.5 | 28 days supply | Qty: 2 | Fill #0

## 2015-08-15 MED FILL — TOUJEO SOLOSTAR 300 UNITS/M: 300 | 27 days supply | Qty: 5 | Fill #0

## 2015-09-28 MED FILL — BD PEN NDL MINI 31GX5MM: 31G X 5 MM | 90 days supply | Qty: 100 | Fill #3

## 2015-09-28 MED FILL — TOUJEO SOLOSTAR 300 UNITS/M: 300 | 27 days supply | Qty: 5 | Fill #1

## 2015-09-28 MED FILL — TRULICITY 1.5 MG/0.5 ML PEN: 1.5 | 28 days supply | Qty: 2 | Fill #1

## 2015-10-25 MED FILL — TRULICITY 1.5 MG/0.5 ML PEN: 1.5 | 28 days supply | Qty: 2 | Fill #2

## 2015-10-25 MED FILL — TOUJEO SOLOSTAR 300 UNITS/M: 300 | 27 days supply | Qty: 5 | Fill #2

## 2015-12-03 ENCOUNTER — Ambulatory Visit (INDEPENDENT_AMBULATORY_CARE_PROVIDER_SITE_OTHER): Payer: 59 | Admitting: Medical

## 2015-12-03 ENCOUNTER — Encounter: Payer: Self-pay | Admitting: Medical

## 2015-12-03 ENCOUNTER — Other Ambulatory Visit (HOSPITAL_COMMUNITY)
Admission: RE | Admit: 2015-12-03 | Discharge: 2015-12-03 | Disposition: A | Discharge status: Home / Self Care | Payer: 59 | Source: Ambulatory Visit | Attending: Medical | Admitting: Medical

## 2015-12-03 VITALS — BP 135/86 | HR 85 | Temp 98.1°F | Ht 70.75 in | Wt 212.0 lb

## 2015-12-03 DIAGNOSIS — J3489 Other specified disorders of nose and nasal sinuses: Secondary | ICD-10-CM

## 2015-12-03 DIAGNOSIS — Z113 Encounter for screening for infections with a predominantly sexual mode of transmission: Secondary | ICD-10-CM

## 2015-12-03 DIAGNOSIS — R05 Cough: Secondary | ICD-10-CM | POA: Diagnosis not present

## 2015-12-03 DIAGNOSIS — R059 Cough, unspecified: Secondary | ICD-10-CM

## 2015-12-03 DIAGNOSIS — J209 Acute bronchitis, unspecified: Secondary | ICD-10-CM | POA: Diagnosis not present

## 2015-12-03 MED ORDER — CEFTRIAXONE SODIUM 1 G IJ SOLR
1.0000 g | Freq: Once | INTRAMUSCULAR | Status: AC
  Administered 2015-12-03: 1 g via INTRAMUSCULAR

## 2015-12-03 MED ORDER — HYDROCODONE-HOMATROPINE 5-1.5 MG/5ML PO SYRP: 5.0000 mL | ORAL_SOLUTION | Freq: Three times a day (TID) | ORAL | Status: DC | PRN

## 2015-12-03 MED ORDER — DOXYCYCLINE HYCLATE 100 MG PO TABS: 100.0000 mg | ORAL_TABLET | Freq: Two times a day (BID) | ORAL | Status: DC

## 2015-12-03 MED FILL — HYDROCODONE-HOMATROPINE SYR: 5-1.5 | 8 days supply | Qty: 120 | Fill #0

## 2015-12-03 MED FILL — DOXYCYCLINE 100 MG TABLET: 100 | 7 days supply | Qty: 14 | Fill #0

## 2015-12-03 NOTE — Patient Instructions (Signed)
For bronchitis we gave you rocephin 1 gram and rx doxycycline. For cough rx hycodan.  We are doing blood and urine std studies. The above antibiotic treatment does potentially treat some std type if bacteria test came back positive.  Follow up in 7 days or as needed

## 2015-12-03 NOTE — Progress Notes (Signed)
Pre visit review using our clinic tool,if applicable. No additional management support is needed unless otherwise documented below in the visit note.  

## 2015-12-03 NOTE — Progress Notes (Signed)
Subjective:    Patient ID: [name], male    DOB: 06/19/1965, 50 y.o.   MRN: [id number]  HPI  Pt in for possible bronchitis. Pt has some chest congestion. Pt states his throat hurts some as well.  Pt is coughing up some mucous every morning for about 7 days. Pt some nasal congestion and sinus pressure. Pt is coughing some at night. (symptoms for about 5-7 days)  No wheezing.  Pt also has concern about possible std. No pt had some unprotected sex the other week ago. He had sex with his ex-girlfriend. Pt has not sex with wife.   No testicle pain. No discharge.    Review of Systems  Constitutional: Negative for chills and fatigue.  HENT: Positive for congestion, sinus pressure and sore throat. Negative for drooling, ear pain, hearing loss and sneezing.   Respiratory: Negative for cough, chest tightness, shortness of breath and wheezing.   Cardiovascular: Negative for chest pain and palpitations.  Gastrointestinal: Negative for abdominal pain, diarrhea and constipation.  Genitourinary: Negative for dysuria and enuresis.  Musculoskeletal: Negative for back pain.  Skin: Negative for color change and rash.  Neurological: Negative for dizziness and headaches.  Hematological: Negative for adenopathy. Does not bruise/bleed easily.  Psychiatric/Behavioral: Negative for behavioral problems and confusion.    Past Medical History  Diagnosis Date  . Diabetes mellitus without complication [facility])      Social History   Social History  . Marital Status: Married    Spouse Name: N/A  . Number of Children: N/A  . Years of Education: N/A   Occupational History  . Not on file.   Social History Main Topics  . Smoking status: Current Every Day Smoker -- 1.00 packs/day for 30 years    Types: Cigarettes    Start date: 10/29/1984  . Smokeless tobacco: Never Used  . Alcohol Use: No  . Drug Use: No  . Sexual Activity: Yes   Other Topics Concern  . Not on file   Social  History Narrative    Past Surgical History  Procedure Laterality Date  . Appendectomy      Family History  Problem Relation Age of Onset  . Diabetes Maternal Grandmother   . Arthritis Maternal Grandmother   . Arthritis Maternal Grandfather   . Arthritis Paternal Grandmother   . Arthritis Paternal Grandfather     No Known Allergies  Current Outpatient Prescriptions on File Prior to Visit  Medication Sig Dispense Refill  . Insulin Glargine (TOUJEO SOLOSTAR) 300 UNIT/ML SOPN Inject 50 Units into the skin daily.    . insulin NPH Human (HUMULIN N,NOVOLIN N) 100 UNIT/ML injection Inject 18 Units into the skin 2 (two) times daily before a meal. Not taking    . metFORMIN (GLUCOPHAGE) 500 MG tablet Take 500 mg by mouth 2 (two) times daily with a meal.    . sildenafil (VIAGRA) 100 MG tablet Take 100 mg by mouth once as needed.    Marland Kitchen aspirin EC 81 MG tablet Take 81 mg by mouth daily. Reported on 07/04/2015    . fluticasone (FLONASE) 50 MCG/ACT nasal spray Place 2 sprays into both nostrils daily. (Patient not taking: Reported on 12/03/2015) 16 g 1  . HYDROcodone-homatropine (HYCODAN) 5-1.5 MG/5ML syrup Take 5 mLs by mouth every 8 (eight) hours as needed for cough. (Patient not taking: Reported on 12/03/2015) 120 mL 0  . nicotine (RA NICOTINE) 14 mg/24hr patch Place 1 patch (14 mg total) onto the skin daily. (Patient not  taking: Reported on 12/03/2015) 28 patch 1   No current facility-administered medications on file prior to visit.    BP 135/86 mmHg  Pulse 85  Temp(Src) 98.1 F (36.7 C) (Oral)  Ht 5' 10.75" (1.797 m)  Wt 212 lb (96.163 kg)  BMI 29.78 kg/m2  SpO2 99%       Objective:   Physical Exam  General  Mental Status - Alert. General Appearance - Well groomed. Not in acute distress.  Skin Rashes- No Rashes.  HEENT Head- Normal. Ear Auditory Canal - Left- Normal. Right - Normal.Tympanic Membrane- Left- Normal. Right- Normal. Eye Sclera/Conjunctiva- Left- Normal. Right-  Normal. Nose & Sinuses Nasal Mucosa- Left-  Boggy and Congested. Right-  Boggy and  Congested.Bilateral maxillary sinus pressure but no frontal sinus pressure. Mouth & Throat Lips: Upper Lip- Normal: no dryness, cracking, pallor, cyanosis, or vesicular eruption. Lower Lip-Normal: no dryness, cracking, pallor, cyanosis or vesicular eruption. Buccal Mucosa- Bilateral- No Aphthous ulcers. Oropharynx- No Discharge or Erythema. Tonsils: Characteristics- Bilateral- mild  Erythema and  Congestion. Size/Enlargement- Bilateral- No enlargement. Discharge- bilateral-None.  Neck Neck- Supple. No Masses.    Chest and Lung Exam Auscultation: Breath Sounds:-Clear even and unlabored.  Cardiovascular Auscultation:Rythm- Regular, rate and rhythm. Murmurs & Other Heart Sounds:Ausculatation of the heart reveal- No Murmurs.  Lymphatic Head & Neck General Head & Neck Lymphatics: Bilateral: Description- No Localized lymphadenopathy.  Genital- no testicle tenderness.No discharge from penis.       Assessment & Plan:  For bronchitis we gave you rocephin 1 gram and rx doxycycline. For cough rx hycodan.  We are doing blood and urine std studies. The above antibiotic treatment does potentially treat some std type if bacteria test came back positive.  Follow up in 7 days or as needed

## 2015-12-04 LAB — URINE CYTOLOGY ANCILLARY ONLY
CHLAMYDIA, DNA PROBE: NEGATIVE
NEISSERIA GONORRHEA: NEGATIVE
Trichomonas: NEGATIVE

## 2015-12-04 LAB — RPR

## 2015-12-04 LAB — HIV ANTIBODY (ROUTINE TESTING W REFLEX): HIV: NONREACTIVE

## 2015-12-06 LAB — HSV(HERPES SMPLX)ABS-I+II(IGG+IGM)-BLD
HSV 1 Glycoprotein G Ab, IgG: 51.7 Index — ABNORMAL HIGH (ref <0.90)
HSV 2 Glycoprotein G Ab, IgG: <0.9 Index (ref <0.90)
Herpes Simplex Vrs I&II-IgM Ab (EIA): 0.23 INDEX

## 2015-12-06 LAB — URINE CYTOLOGY ANCILLARY ONLY: Bacterial vaginitis: NEGATIVE

## 2015-12-12 MED FILL — TRULICITY 1.5 MG/0.5 ML PEN: 1.5 | 28 days supply | Qty: 2 | Fill #3 | Status: TO

## 2015-12-12 MED FILL — TOUJEO SOLOSTAR 300 UNITS/M: 300 | 27 days supply | Qty: 5 | Fill #3

## 2015-12-18 DIAGNOSIS — Z794 Long term (current) use of insulin: Secondary | ICD-10-CM | POA: Diagnosis not present

## 2015-12-18 DIAGNOSIS — E119 Type 2 diabetes mellitus without complications: Secondary | ICD-10-CM | POA: Diagnosis not present

## 2015-12-18 DIAGNOSIS — E1143 Type 2 diabetes mellitus with diabetic autonomic (poly)neuropathy: Secondary | ICD-10-CM | POA: Diagnosis not present

## 2015-12-18 DIAGNOSIS — E291 Testicular hypofunction: Secondary | ICD-10-CM | POA: Diagnosis not present

## 2015-12-18 DIAGNOSIS — Z125 Encounter for screening for malignant neoplasm of prostate: Secondary | ICD-10-CM | POA: Diagnosis not present

## 2015-12-18 DIAGNOSIS — F17200 Nicotine dependence, unspecified, uncomplicated: Secondary | ICD-10-CM | POA: Diagnosis not present

## 2015-12-18 DIAGNOSIS — E559 Vitamin D deficiency, unspecified: Secondary | ICD-10-CM | POA: Diagnosis not present

## 2016-01-01 MED FILL — SM NICOTINE 14 MG/24HR PATC: 14MG/24HR | 28 days supply | Qty: 28 | Fill #0

## 2016-01-01 MED FILL — NICOTINE 21 MG/24HR PATCH: 21 | 28 days supply | Qty: 28 | Fill #0

## 2016-01-10 MED FILL — TRULICITY 1.5 MG/0.5 ML PEN: 1.5 | 30 days supply | Qty: 2 | Fill #5

## 2016-01-10 MED FILL — TOUJEO SOLOSTAR 300 UNITS/M: 300 | 34 days supply | Qty: 5 | Fill #1 | Status: TO

## 2016-02-14 MED FILL — TRULICITY 1.5 MG/0.5 ML PEN: 1.5 | 28 days supply | Qty: 2 | Fill #0

## 2016-02-14 MED FILL — TOUJEO SOLOSTAR 300 UNITS/M: 300 | 34 days supply | Qty: 5 | Fill #0

## 2016-03-28 MED FILL — TRULICITY 1.5 MG/0.5 ML PEN: 1.5 | 28 days supply | Qty: 2 | Fill #1

## 2016-03-28 MED FILL — TOUJEO SOLOSTAR 300 UNITS/M: 300 | 34 days supply | Qty: 5 | Fill #1

## 2016-03-31 MED FILL — UNIFINE PENTIPS 31GX3/16: 31G X 5 MM | 90 days supply | Qty: 100 | Fill #0 | Status: TO

## 2016-03-31 MED FILL — UNIFINE PENTIPS 31GX3/16": 31G X 5 MM | 90 days supply | Qty: 100 | Fill #0 | Status: TO

## 2016-06-16 DIAGNOSIS — E559 Vitamin D deficiency, unspecified: Secondary | ICD-10-CM | POA: Diagnosis not present

## 2016-06-16 DIAGNOSIS — N521 Erectile dysfunction due to diseases classified elsewhere: Secondary | ICD-10-CM | POA: Diagnosis not present

## 2016-06-16 DIAGNOSIS — E291 Testicular hypofunction: Secondary | ICD-10-CM | POA: Diagnosis not present

## 2016-06-16 DIAGNOSIS — E1165 Type 2 diabetes mellitus with hyperglycemia: Secondary | ICD-10-CM | POA: Diagnosis not present

## 2016-06-16 DIAGNOSIS — Z125 Encounter for screening for malignant neoplasm of prostate: Secondary | ICD-10-CM | POA: Diagnosis not present

## 2016-06-16 DIAGNOSIS — E1169 Type 2 diabetes mellitus with other specified complication: Secondary | ICD-10-CM | POA: Diagnosis not present

## 2016-06-20 DIAGNOSIS — E1165 Type 2 diabetes mellitus with hyperglycemia: Secondary | ICD-10-CM | POA: Diagnosis not present

## 2016-06-20 DIAGNOSIS — E291 Testicular hypofunction: Secondary | ICD-10-CM | POA: Diagnosis not present

## 2016-06-20 DIAGNOSIS — N521 Erectile dysfunction due to diseases classified elsewhere: Secondary | ICD-10-CM | POA: Diagnosis not present

## 2016-06-20 DIAGNOSIS — E559 Vitamin D deficiency, unspecified: Secondary | ICD-10-CM | POA: Diagnosis not present

## 2016-06-20 DIAGNOSIS — F17200 Nicotine dependence, unspecified, uncomplicated: Secondary | ICD-10-CM | POA: Diagnosis not present

## 2016-06-20 DIAGNOSIS — E119 Type 2 diabetes mellitus without complications: Secondary | ICD-10-CM | POA: Diagnosis not present

## 2016-06-20 DIAGNOSIS — Z794 Long term (current) use of insulin: Secondary | ICD-10-CM | POA: Diagnosis not present

## 2016-06-20 DIAGNOSIS — E1169 Type 2 diabetes mellitus with other specified complication: Secondary | ICD-10-CM | POA: Diagnosis not present

## 2016-06-20 MED FILL — CHANTIX STARTING MONTH BOX: 0.5 MG X 11 | 28 days supply | Qty: 53 | Fill #0

## 2016-06-20 MED FILL — TOUJEO SOLOSTAR 300 UNITS/M: 300 | 27 days supply | Qty: 5 | Fill #0

## 2016-06-20 MED FILL — TRULICITY 1.5 MG/0.5 ML PEN: 1.5 | 28 days supply | Qty: 2 | Fill #0

## 2016-07-09 ENCOUNTER — Ambulatory Visit (INDEPENDENT_AMBULATORY_CARE_PROVIDER_SITE_OTHER): Payer: 59 | Admitting: Medical

## 2016-07-09 ENCOUNTER — Encounter: Payer: Self-pay | Admitting: Medical

## 2016-07-09 VITALS — BP 133/81 | HR 92 | Temp 98.2°F | Ht 70.75 in | Wt 214.8 lb

## 2016-07-09 DIAGNOSIS — R05 Cough: Secondary | ICD-10-CM | POA: Diagnosis not present

## 2016-07-09 DIAGNOSIS — J111 Influenza due to unidentified influenza virus with other respiratory manifestations: Secondary | ICD-10-CM | POA: Diagnosis not present

## 2016-07-09 DIAGNOSIS — J3489 Other specified disorders of nose and nasal sinuses: Secondary | ICD-10-CM

## 2016-07-09 DIAGNOSIS — R059 Cough, unspecified: Secondary | ICD-10-CM

## 2016-07-09 DIAGNOSIS — R52 Pain, unspecified: Secondary | ICD-10-CM | POA: Diagnosis not present

## 2016-07-09 LAB — POC INFLUENZA A&B (BINAX/QUICKVUE)
INFLUENZA A, POC: NEGATIVE
INFLUENZA B, POC: NEGATIVE

## 2016-07-09 MED ORDER — OSELTAMIVIR PHOSPHATE 75 MG PO CAPS: 75.0000 mg | ORAL_CAPSULE | Freq: Two times a day (BID) | ORAL | 0 refills | Status: DC

## 2016-07-09 MED ORDER — FLUTICASONE PROPIONATE 50 MCG/ACT NA SUSP: 2.0000 | Freq: Every day | NASAL | 1 refills | Status: DC

## 2016-07-09 MED ORDER — HYDROCODONE-HOMATROPINE 5-1.5 MG/5ML PO SYRP: 5.0000 mL | ORAL_SOLUTION | Freq: Three times a day (TID) | ORAL | 0 refills | Status: DC | PRN

## 2016-07-09 MED ORDER — AZITHROMYCIN 250 MG PO TABS: ORAL_TABLET | ORAL | 0 refills | Status: DC

## 2016-07-09 MED FILL — HYDROCODONE-HOMATROPINE SYR: 5-1.5 | 8 days supply | Qty: 120 | Fill #0

## 2016-07-09 MED FILL — OSELTAMIVIR PHOS 75 MG CAP: 75 | 5 days supply | Qty: 10 | Fill #0

## 2016-07-09 MED FILL — AZITHROMYCIN 250 MG TABLET: 250 | 5 days supply | Qty: 6 | Fill #0

## 2016-07-09 NOTE — Patient Instructions (Addendum)
You have flu syndrome despite negative flu test(counseled pt on possible false negative result). So best to rx tamiflu.  Rest hydrate and tylenol for fever.  For cough rx hycodan. For nasal congestion rx flonase.  You may have early  secondary sinus infection. I am going to go ahead and write azithromycin. Start today as well.  Follow up in 7 days or as needed

## 2016-07-09 NOTE — Progress Notes (Signed)
Pre visit review using our clinic tool,if applicable. No additional management support is needed unless otherwise documented below in the visit note.  

## 2016-07-09 NOTE — Progress Notes (Signed)
Subjective:    Patient ID: Dorrien Grunder Knauff, male    DOB: 02-28-1966, 51 y.o.   MRN: [id number]  HPI  Pt in with one day of severe fatigue, body aches, mild ha and mild cough. Pt using cough med that is helping but running out today(prior rx I gave him). Some chills and sweats. Pt wife had the flu over past week.   Pt has some sinus pressure as well.  Pt saw his diabetic Dr about 2 weeks ago.    Review of Systems  Constitutional: Positive for chills and fatigue. Negative for fever.  HENT: Positive for congestion, sinus pain and sinus pressure. Negative for mouth sores, postnasal drip, rhinorrhea and sneezing.   Respiratory: Positive for cough. Negative for chest tightness, shortness of breath and wheezing.   Cardiovascular: Negative for chest pain and palpitations.  Gastrointestinal: Negative for abdominal pain.  Musculoskeletal: Positive for myalgias. Negative for arthralgias, neck pain and neck stiffness.  Skin: Negative for rash.  Neurological: Negative for dizziness, weakness and headaches.  Hematological: Negative for adenopathy. Does not bruise/bleed easily.  Psychiatric/Behavioral: Negative for behavioral problems and dysphoric mood.   Past Medical History:  Diagnosis Date  . Diabetes mellitus without complication [facility])      Social History   Social History  . Marital status: Married    Spouse name: N/A  . Number of children: N/A  . Years of education: N/A   Occupational History  . Not on file.   Social History Main Topics  . Smoking status: Current Every Day Smoker    Packs/day: 1.00    Years: 30.00    Types: Cigarettes    Start date: 10/29/1984  . Smokeless tobacco: Never Used  . Alcohol use No  . Drug use: No  . Sexual activity: Yes   Other Topics Concern  . Not on file   Social History Narrative  . No narrative on file    Past Surgical History:  Procedure Laterality Date  . APPENDECTOMY      Family History  Problem Relation Age of Onset   . Diabetes Maternal Grandmother   . Arthritis Maternal Grandmother   . Arthritis Maternal Grandfather   . Arthritis Paternal Grandmother   . Arthritis Paternal Grandfather     No Known Allergies  Current Outpatient Prescriptions on File Prior to Visit  Medication Sig Dispense Refill  . aspirin EC 81 MG tablet Take 81 mg by mouth daily. Reported on 07/04/2015    . doxycycline (VIBRA-TABS) 100 MG tablet Take 1 tablet (100 mg total) by mouth 2 (two) times daily. 14 tablet 0  . fluticasone (FLONASE) 50 MCG/ACT nasal spray Place 2 sprays into both nostrils daily. 16 g 1  . HYDROcodone-homatropine (HYCODAN) 5-1.5 MG/5ML syrup Take 5 mLs by mouth every 8 (eight) hours as needed for cough. 120 mL 0  . Insulin Glargine (TOUJEO SOLOSTAR) 300 UNIT/ML SOPN Inject 50 Units into the skin daily.    . insulin NPH Human (HUMULIN N,NOVOLIN N) 100 UNIT/ML injection Inject 18 Units into the skin 2 (two) times daily before a meal. Not taking    . metFORMIN (GLUCOPHAGE) 500 MG tablet Take 500 mg by mouth 2 (two) times daily with a meal.    . nicotine (RA NICOTINE) 14 mg/24hr patch Place 1 patch (14 mg total) onto the skin daily. 28 patch 1  . sildenafil (VIAGRA) 100 MG tablet Take 100 mg by mouth once as needed.     No current facility-administered medications on  file prior to visit.     BP 133/81   Pulse 92   Temp 98.2 F (36.8 C) (Oral)   Ht 5' 10.75" (1.797 m)   Wt 214 lb 12.8 oz (97.4 kg)   SpO2 100%   BMI 30.17 kg/m       Objective:   Physical Exam  General  Mental Status - Alert. General Appearance - Well groomed. Not in acute distress.  Skin Rashes- No Rashes.  HEENT Head- Normal. Ear Auditory Canal - Left- Normal. Right - Normal.Tympanic Membrane- Left- Normal. Right- Normal. Eye Sclera/Conjunctiva- Left- Normal. Right- Normal. Nose & Sinuses Nasal Mucosa- Left-  Boggy and Congested. Right-  Boggy and  Congested.Bilateral  maxillary but no frontal sinus pressure. Mouth &  Throat Lips: Upper Lip- Normal: no dryness, cracking, pallor, cyanosis, or vesicular eruption. Lower Lip-Normal: no dryness, cracking, pallor, cyanosis or vesicular eruption. Buccal Mucosa- Bilateral- No Aphthous ulcers. Oropharynx- No Discharge or Erythema. Tonsils: Characteristics- Bilateral- No Erythema or Congestion. Size/Enlargement- Bilateral- No enlargement. Discharge- bilateral-None.  Neck Neck- Supple. No Masses.   Chest and Lung Exam Auscultation: Breath Sounds:-Clear even and unlabored.  Cardiovascular Auscultation:Rythm- Regular, rate and rhythm. Murmurs & Other Heart Sounds:Ausculatation of the heart reveal- No Murmurs.  Lymphatic Head & Neck General Head & Neck Lymphatics: Bilateral: Description- No Localized lymphadenopathy.       Assessment & Plan:  You have flu syndrome despite negative flu test(counseled pt on possible false negative result). So best to rx tamiflu.  Rest hydrate and tylenol for fever.  For cough rx hycodan. For nasal congestion rx flonase.  You may have early secondary  sinus infection. I am going to go ahead and write azithromycin. Start today.  Follow up in 7 days or as needed  Abigail Teall, Ramon Dredge, [employer]

## 2016-07-18 MED FILL — TOUJEO SOLOSTAR 300 UNITS/M: 300 | 27 days supply | Qty: 5 | Fill #1

## 2016-07-18 MED FILL — TRULICITY 1.5 MG/0.5 ML PEN: 1.5 | 28 days supply | Qty: 2 | Fill #1

## 2016-08-20 MED FILL — ANDROGEL 1.62%(2.5G) GEL PC: 40.5 MG/2.5 | 30 days supply | Qty: 75 | Fill #0

## 2016-09-12 MED FILL — TOUJEO SOLOSTAR 300 UNITS/M: 300 | 27 days supply | Qty: 5 | Fill #2

## 2016-09-12 MED FILL — TRULICITY 1.5 MG/0.5 ML PEN: 1.5 | 28 days supply | Qty: 2 | Fill #2

## 2016-09-17 ENCOUNTER — Ambulatory Visit (HOSPITAL_BASED_OUTPATIENT_CLINIC_OR_DEPARTMENT_OTHER)
Admission: RE | Admit: 2016-09-17 | Discharge: 2016-09-17 | Disposition: A | Discharge status: Home / Self Care | Payer: 59 | Source: Ambulatory Visit | Attending: Medical | Admitting: Medical

## 2016-09-17 ENCOUNTER — Ambulatory Visit (INDEPENDENT_AMBULATORY_CARE_PROVIDER_SITE_OTHER): Payer: 59 | Admitting: Medical

## 2016-09-17 ENCOUNTER — Encounter: Payer: Self-pay | Admitting: Medical

## 2016-09-17 VITALS — BP 123/83 | HR 82 | Temp 97.9°F | Resp 16 | Ht 71.0 in | Wt 215.0 lb

## 2016-09-17 DIAGNOSIS — M5441 Lumbago with sciatica, right side: Secondary | ICD-10-CM

## 2016-09-17 DIAGNOSIS — M545 Low back pain: Secondary | ICD-10-CM | POA: Diagnosis not present

## 2016-09-17 MED ORDER — CYCLOBENZAPRINE HCL 10 MG PO TABS: 10.0000 mg | ORAL_TABLET | Freq: Every day | ORAL | 0 refills | Status: DC

## 2016-09-17 MED ORDER — KETOROLAC TROMETHAMINE 60 MG/2ML IM SOLN
60.0000 mg | Freq: Once | INTRAMUSCULAR | Status: AC
  Administered 2016-09-17: 60 mg via INTRAMUSCULAR

## 2016-09-17 MED ORDER — DICLOFENAC SODIUM 75 MG PO TBEC: 75.0000 mg | DELAYED_RELEASE_TABLET | Freq: Two times a day (BID) | ORAL | 0 refills | Status: DC

## 2016-09-17 MED ORDER — TRAMADOL HCL 50 MG PO TABS: 50.0000 mg | ORAL_TABLET | Freq: Four times a day (QID) | ORAL | 0 refills | Status: DC | PRN

## 2016-09-17 MED FILL — CYCLOBENZAPRINE 10 MG TAB: 10 | 30 days supply | Qty: 30 | Fill #0

## 2016-09-17 MED FILL — DICLOFENAC SOD 75 MG TAB EC: 75 | 15 days supply | Qty: 30 | Fill #0

## 2016-09-17 MED FILL — traMADol HCL 50 MG TABS: 50 | 3 days supply | Qty: 12 | Fill #0

## 2016-09-17 NOTE — Progress Notes (Signed)
Subjective:    Patient ID: [name], male    DOB: 10-Oct-1965, 51 y.o.   MRN: [id number]  HPI  Pt in with some back pain.  Pt states pain moderate to severe. Pain for about 2 weeks. He thinks pain may have been related to picking up cement where gutter water runs. He also picked up 2 TV's around time pain onset  and did some work around the house. Told on military physical had scoliosis.   Pt otc nsaids not helping.  Pt never had xrays. Some pain that has been shooting to his rt leg at times all the way to foot. No numbness to legs. No foot weakness.    Review of Systems  Constitutional: Negative for chills and fatigue.  Respiratory: Negative for chest tightness, shortness of breath and wheezing.   Cardiovascular: Negative for chest pain and palpitations.  Gastrointestinal: Negative for abdominal pain, blood in stool, constipation, diarrhea and vomiting.  Musculoskeletal: Positive for back pain.  Skin: Negative for rash.  Neurological: Negative for dizziness, speech difficulty, weakness, numbness and headaches.       Rare occaisional pain runs down his rt leg.  Hematological: Negative for adenopathy. Does not bruise/bleed easily.  Psychiatric/Behavioral: Negative for behavioral problems and confusion.    Past Medical History:  Diagnosis Date  . Diabetes mellitus without complication [facility])      Social History   Social History  . Marital status: Married    Spouse name: N/A  . Number of children: N/A  . Years of education: N/A   Occupational History  . Not on file.   Social History Main Topics  . Smoking status: Current Every Day Smoker    Packs/day: 1.00    Years: 30.00    Types: Cigarettes    Start date: 10/29/1984  . Smokeless tobacco: Never Used  . Alcohol use No  . Drug use: No  . Sexual activity: Yes   Other Topics Concern  . Not on file   Social History Narrative  . No narrative on file    Past Surgical History:  Procedure Laterality  Date  . APPENDECTOMY      Family History  Problem Relation Age of Onset  . Diabetes Maternal Grandmother   . Arthritis Maternal Grandmother   . Arthritis Maternal Grandfather   . Arthritis Paternal Grandmother   . Arthritis Paternal Grandfather     No Known Allergies  Current Outpatient Prescriptions on File Prior to Visit  Medication Sig Dispense Refill  . aspirin EC 81 MG tablet Take 81 mg by mouth daily. Reported on 07/04/2015    . Insulin Glargine (TOUJEO SOLOSTAR) 300 UNIT/ML SOPN Inject 50 Units into the skin daily.    . nicotine (RA NICOTINE) 14 mg/24hr patch Place 1 patch (14 mg total) onto the skin daily. 28 patch 1  . sildenafil (VIAGRA) 100 MG tablet Take 100 mg by mouth once as needed.     No current facility-administered medications on file prior to visit.     BP 123/83 (BP Location: Left Arm, Cuff Size: Normal)   Pulse 82   Temp 97.9 F (36.6 C) (Oral)   Resp 16   Ht 5\' 11"  (1.803 m)   Wt 215 lb (97.5 kg)   SpO2 100%   BMI 29.99 kg/m       Objective:   Physical Exam  General Appearance- Not in acute distress.    Chest and Lung Exam Auscultation: Breath sounds:-Normal. Clear even and unlabored. Adventitious  sounds:- No Adventitious sounds.  Cardiovascular Auscultation:Rythm - Regular, rate and rythm. Heart Sounds -Normal heart sounds.  Abdomen Inspection:-Inspection Normal.  Palpation/Perucssion: Palpation and Percussion of the abdomen reveal- Non Tender, No Rebound tenderness, No rigidity(Guarding) and No Palpable abdominal masses.  Liver:-Normal.  Spleen:- Normal.   Back Mid lumbar spine tenderness to palpation.(rt si tenderness to palpation) Pain on straight leg lift. Pain on lateral movements and flexion/extension of the spine.  Lower ext neurologic  L5-S1 sensation intact bilaterally. Normal patellar reflexes bilaterally. No foot drop bilaterally.      Assessment & Plan:  For back pain with some sciatic features will get lumbar  xray today. Toradol 60 mg IM  today in the office.   Can start diclofenac tomorrow.  Rx tramadol pain medication to use if needed.  Rx flexeril to use at night.  Can refer to sports med for acute pain but also for intermittent hx of low level pain as you described.  Follow up in 2-3 weeks or as needed  Teandre Hamre, Ramon Dredge, [employer]

## 2016-09-17 NOTE — Progress Notes (Signed)
Pre visit review using our clinic review tool, if applicable. No additional management support is needed unless otherwise documented below in the visit note. 

## 2016-09-17 NOTE — Patient Instructions (Addendum)
For back pain with some sciatic features will get lumbar xray today. Toradol 60 mg IM  today in the office.   Can start diclofenac tomorrow.  Rx tramadol pain medication to use if needed.  Rx flexeril to use at night.  Can refer to sports med for acute pain but also for intermittent hx of low level pain as you described.  Follow up in 2-3 weeks or as needed   Back Exercises If you have pain in your back, do these exercises 2-3 times each day or as told by your doctor. When the pain goes away, do the exercises once each day, but repeat the steps more times for each exercise (do more repetitions). If you do not have pain in your back, do these exercises once each day or as told by your doctor. Exercises Single Knee to Chest   Do these steps 3-5 times in a row for each leg: 1. Lie on your back on a firm bed or the floor with your legs stretched out. 2. Bring one knee to your chest. 3. Hold your knee to your chest by grabbing your knee or thigh. 4. Pull on your knee until you feel a gentle stretch in your lower back. 5. Keep doing the stretch for 10-30 seconds. 6. Slowly let go of your leg and straighten it. Pelvic Tilt   Do these steps 5-10 times in a row: 1. Lie on your back on a firm bed or the floor with your legs stretched out. 2. Bend your knees so they point up to the ceiling. Your feet should be flat on the floor. 3. Tighten your lower belly (abdomen) muscles to press your lower back against the floor. This will make your tailbone point up to the ceiling instead of pointing down to your feet or the floor. 4. Stay in this position for 5-10 seconds while you gently tighten your muscles and breathe evenly. Cat-Cow   Do these steps until your lower back bends more easily: 1. Get on your hands and knees on a firm surface. Keep your hands under your shoulders, and keep your knees under your hips. You may put padding under your knees. 2. Let your head hang down, and make your tailbone  point down to the floor so your lower back is round like the back of a cat. 3. Stay in this position for 5 seconds. 4. Slowly lift your head and make your tailbone point up to the ceiling so your back hangs low (sags) like the back of a cow. 5. Stay in this position for 5 seconds. Press-Ups   Do these steps 5-10 times in a row: 1. Lie on your belly (face-down) on the floor. 2. Place your hands near your head, about shoulder-width apart. 3. While you keep your back relaxed and keep your hips on the floor, slowly straighten your arms to raise the top half of your body and lift your shoulders. Do not use your back muscles. To make yourself more comfortable, you may change where you place your hands. 4. Stay in this position for 5 seconds. 5. Slowly return to lying flat on the floor. Bridges   Do these steps 10 times in a row: 1. Lie on your back on a firm surface. 2. Bend your knees so they point up to the ceiling. Your feet should be flat on the floor. 3. Tighten your butt muscles and lift your butt off of the floor until your waist is almost as high as your  knees. If you do not feel the muscles working in your butt and the back of your thighs, slide your feet 1-2 inches farther away from your butt. 4. Stay in this position for 3-5 seconds. 5. Slowly lower your butt to the floor, and let your butt muscles relax. If this exercise is too easy, try doing it with your arms crossed over your chest. Belly Crunches   Do these steps 5-10 times in a row: 1. Lie on your back on a firm bed or the floor with your legs stretched out. 2. Bend your knees so they point up to the ceiling. Your feet should be flat on the floor. 3. Cross your arms over your chest. 4. Tip your chin a little bit toward your chest but do not bend your neck. 5. Tighten your belly muscles and slowly raise your chest just enough to lift your shoulder blades a tiny bit off of the floor. 6. Slowly lower your chest and your head to  the floor. Back Lifts  Do these steps 5-10 times in a row: 1. Lie on your belly (face-down) with your arms at your sides, and rest your forehead on the floor. 2. Tighten the muscles in your legs and your butt. 3. Slowly lift your chest off of the floor while you keep your hips on the floor. Keep the back of your head in line with the curve in your back. Look at the floor while you do this. 4. Stay in this position for 3-5 seconds. 5. Slowly lower your chest and your face to the floor. Contact a doctor if:  Your back pain gets a lot worse when you do an exercise.  Your back pain does not lessen 2 hours after you exercise. If you have any of these problems, stop doing the exercises. Do not do them again unless your doctor says it is okay. Get help right away if:  You have sudden, very bad back pain. If this happens, stop doing the exercises. Do not do them again unless your doctor says it is okay. This information is not intended to replace advice given to you by your health care provider. Make sure you discuss any questions you have with your health care provider. Document Released: 06/28/2010 Document Revised: 11/01/2015 Document Reviewed: 07/20/2014 Elsevier Interactive Patient Education  2017 [employer].

## 2016-09-17 NOTE — Addendum Note (Signed)
Addended by: Thelma Barge D on: 09/17/2016 09:03 AM   Modules accepted: Orders

## 2016-09-26 ENCOUNTER — Ambulatory Visit: Payer: 59 | Admitting: Family Medicine

## 2016-10-14 MED FILL — TRULICITY 1.5 MG/0.5 ML PEN: 1.5 | 28 days supply | Qty: 2 | Fill #3 | Status: TO

## 2016-10-24 MED FILL — TOUJEO SOLOSTAR 300 UNITS/M: 300 | 27 days supply | Qty: 5 | Fill #3

## 2016-10-28 ENCOUNTER — Encounter: Payer: Self-pay | Admitting: Medical

## 2016-10-28 MED ORDER — SILDENAFIL CITRATE 100 MG PO TABS: 100.0000 mg | ORAL_TABLET | Freq: Every day | ORAL | 0 refills | Status: DC | PRN

## 2016-10-28 NOTE — Telephone Encounter (Signed)
rx viagra today.

## 2016-10-29 DIAGNOSIS — E1165 Type 2 diabetes mellitus with hyperglycemia: Secondary | ICD-10-CM | POA: Diagnosis not present

## 2016-10-29 DIAGNOSIS — E559 Vitamin D deficiency, unspecified: Secondary | ICD-10-CM | POA: Diagnosis not present

## 2016-10-29 DIAGNOSIS — E291 Testicular hypofunction: Secondary | ICD-10-CM | POA: Diagnosis not present

## 2016-10-29 MED FILL — SILDENAFIL 100 MG TABLET: 100 | 30 days supply | Qty: 6 | Fill #0

## 2016-10-31 DIAGNOSIS — F17200 Nicotine dependence, unspecified, uncomplicated: Secondary | ICD-10-CM | POA: Diagnosis not present

## 2016-10-31 DIAGNOSIS — E559 Vitamin D deficiency, unspecified: Secondary | ICD-10-CM | POA: Diagnosis not present

## 2016-10-31 DIAGNOSIS — F4321 Adjustment disorder with depressed mood: Secondary | ICD-10-CM | POA: Diagnosis not present

## 2016-10-31 DIAGNOSIS — Z794 Long term (current) use of insulin: Secondary | ICD-10-CM | POA: Diagnosis not present

## 2016-10-31 DIAGNOSIS — E291 Testicular hypofunction: Secondary | ICD-10-CM | POA: Diagnosis not present

## 2016-10-31 DIAGNOSIS — E1165 Type 2 diabetes mellitus with hyperglycemia: Secondary | ICD-10-CM | POA: Diagnosis not present

## 2016-11-10 MED FILL — TRULICITY 1.5 MG/0.5 ML PEN: 1.5 | 28 days supply | Qty: 2 | Fill #0

## 2016-11-26 MED FILL — TOUJEO SOLOSTAR 300 UNITS/M: 300 | 27 days supply | Qty: 5 | Fill #0

## 2016-12-05 MED FILL — TRULICITY 1.5 MG/0.5 ML PEN: 1.5 | 28 days supply | Qty: 2 | Fill #1

## 2017-01-13 DIAGNOSIS — E291 Testicular hypofunction: Secondary | ICD-10-CM | POA: Diagnosis not present

## 2017-01-13 DIAGNOSIS — E1165 Type 2 diabetes mellitus with hyperglycemia: Secondary | ICD-10-CM | POA: Diagnosis not present

## 2017-01-13 DIAGNOSIS — E559 Vitamin D deficiency, unspecified: Secondary | ICD-10-CM | POA: Diagnosis not present

## 2017-01-19 DIAGNOSIS — E1165 Type 2 diabetes mellitus with hyperglycemia: Secondary | ICD-10-CM | POA: Diagnosis not present

## 2017-01-19 DIAGNOSIS — E559 Vitamin D deficiency, unspecified: Secondary | ICD-10-CM | POA: Diagnosis not present

## 2017-01-19 DIAGNOSIS — F4321 Adjustment disorder with depressed mood: Secondary | ICD-10-CM | POA: Diagnosis not present

## 2017-01-19 DIAGNOSIS — F17200 Nicotine dependence, unspecified, uncomplicated: Secondary | ICD-10-CM | POA: Diagnosis not present

## 2017-01-19 DIAGNOSIS — E291 Testicular hypofunction: Secondary | ICD-10-CM | POA: Diagnosis not present

## 2017-01-19 DIAGNOSIS — Z794 Long term (current) use of insulin: Secondary | ICD-10-CM | POA: Diagnosis not present

## 2017-01-19 DIAGNOSIS — E538 Deficiency of other specified B group vitamins: Secondary | ICD-10-CM | POA: Diagnosis not present

## 2017-01-19 MED FILL — ANDROGEL 1.62%(2.5G) GEL PC: 40.5 MG/2.5 | 30 days supply | Qty: 150 | Fill #0

## 2017-01-19 MED FILL — TOUJEO SOLOSTAR 300 UNITS/M: 300 | 27 days supply | Qty: 5 | Fill #0

## 2017-01-19 MED FILL — CHANTIX STARTING MONTH BOX: 0.5 MG X 11 | 28 days supply | Qty: 53 | Fill #0

## 2017-01-19 MED FILL — PENTIPS 31G X 5 MM MISC: 31G X 5 MM | 90 days supply | Qty: 100 | Fill #0

## 2017-01-19 MED FILL — TRULICITY 1.5 MG/0.5 ML PEN: 1.5 | 28 days supply | Qty: 2 | Fill #0

## 2017-02-13 MED FILL — TOUJEO SOLOSTAR 300 UNITS/M: 300 | 27 days supply | Qty: 5 | Fill #1

## 2017-02-13 MED FILL — TRULICITY 1.5 MG/0.5 ML PEN: 1.5 | 28 days supply | Qty: 2 | Fill #1

## 2017-03-12 ENCOUNTER — Telehealth: Payer: Self-pay | Admitting: Medical

## 2017-03-12 ENCOUNTER — Encounter: Payer: Self-pay | Admitting: Medical

## 2017-03-12 DIAGNOSIS — G8929 Other chronic pain: Secondary | ICD-10-CM

## 2017-03-12 DIAGNOSIS — M545 Low back pain: Principal | ICD-10-CM

## 2017-03-12 MED ORDER — DICLOFENAC SODIUM 75 MG PO TBEC: 75.0000 mg | DELAYED_RELEASE_TABLET | Freq: Two times a day (BID) | ORAL | 0 refills | Status: DC

## 2017-03-12 MED ORDER — TRAMADOL HCL 50 MG PO TABS: 50.0000 mg | ORAL_TABLET | Freq: Four times a day (QID) | ORAL | 0 refills | Status: DC | PRN

## 2017-03-12 NOTE — Telephone Encounter (Signed)
Notify pt I did refer to orthopedist. Also I sent in diclofenac. Printed tramadol and you can fax that in. Let him know that if referral delayed and he needs refill then please make appointment.

## 2017-03-12 NOTE — Telephone Encounter (Signed)
error 

## 2017-03-12 NOTE — Telephone Encounter (Signed)
Opened due to epic error.

## 2017-03-12 NOTE — Telephone Encounter (Signed)
Error epic

## 2017-03-13 MED ORDER — CYCLOBENZAPRINE HCL 10 MG PO TABS: 10.0000 mg | ORAL_TABLET | Freq: Every day | ORAL | 0 refills | Status: DC

## 2017-03-13 MED FILL — traMADol HCL 50 MG TABS: 50 | 2 days supply | Qty: 6 | Fill #0

## 2017-03-13 MED FILL — DICLOFENAC SOD EC 75 MG TAB: 75 | 15 days supply | Qty: 30 | Fill #0

## 2017-03-13 MED FILL — CYCLOBENZAPRINE 10 MG TAB: 10 | 10 days supply | Qty: 10 | Fill #0

## 2017-03-13 NOTE — Telephone Encounter (Signed)
Pt notified rx sent to pharmacy

## 2017-03-13 NOTE — Addendum Note (Signed)
Addended by: Orlene Och on: 03/13/2017 10:18 AM   Modules accepted: Orders

## 2017-03-27 DIAGNOSIS — M5416 Radiculopathy, lumbar region: Secondary | ICD-10-CM | POA: Diagnosis not present

## 2017-03-27 MED FILL — CYCLOBENZAPRINE 10 MG TAB: 10 | 15 days supply | Qty: 30 | Fill #0

## 2017-03-27 MED FILL — traMADol HCL 50 MG TABS: 50 | 10 days supply | Qty: 30 | Fill #0

## 2017-03-27 MED FILL — MELOXICAM 15 MG TABLET: 15 | 30 days supply | Qty: 30 | Fill #0

## 2017-04-09 MED FILL — TOUJEO SOLOSTAR 300 UNITS/M: 300 | 27 days supply | Qty: 5 | Fill #2

## 2017-04-09 MED FILL — TRULICITY 1.5 MG/0.5 ML PEN: 1.5 | 28 days supply | Qty: 2 | Fill #2

## 2017-04-23 DIAGNOSIS — M5416 Radiculopathy, lumbar region: Secondary | ICD-10-CM | POA: Diagnosis not present

## 2017-04-23 DIAGNOSIS — M5136 Other intervertebral disc degeneration, lumbar region: Secondary | ICD-10-CM | POA: Diagnosis not present

## 2017-04-23 MED FILL — traMADol HCL 50 MG TABS: 50 | 10 days supply | Qty: 30 | Fill #0

## 2017-04-23 MED FILL — CYCLOBENZAPRINE 10 MG TAB: 10 | 15 days supply | Qty: 30 | Fill #0

## 2017-04-23 MED FILL — MELOXICAM 15 MG TABLET: 15 | 30 days supply | Qty: 30 | Fill #0

## 2017-05-12 DIAGNOSIS — E559 Vitamin D deficiency, unspecified: Secondary | ICD-10-CM | POA: Diagnosis not present

## 2017-05-12 DIAGNOSIS — E1165 Type 2 diabetes mellitus with hyperglycemia: Secondary | ICD-10-CM | POA: Diagnosis not present

## 2017-05-12 DIAGNOSIS — E291 Testicular hypofunction: Secondary | ICD-10-CM | POA: Diagnosis not present

## 2017-05-12 DIAGNOSIS — Z794 Long term (current) use of insulin: Secondary | ICD-10-CM | POA: Diagnosis not present

## 2017-05-15 DIAGNOSIS — E1169 Type 2 diabetes mellitus with other specified complication: Secondary | ICD-10-CM | POA: Diagnosis not present

## 2017-05-15 DIAGNOSIS — E559 Vitamin D deficiency, unspecified: Secondary | ICD-10-CM | POA: Diagnosis not present

## 2017-05-15 DIAGNOSIS — E291 Testicular hypofunction: Secondary | ICD-10-CM | POA: Diagnosis not present

## 2017-05-15 DIAGNOSIS — N521 Erectile dysfunction due to diseases classified elsewhere: Secondary | ICD-10-CM | POA: Diagnosis not present

## 2017-05-15 DIAGNOSIS — F17200 Nicotine dependence, unspecified, uncomplicated: Secondary | ICD-10-CM | POA: Diagnosis not present

## 2017-05-15 DIAGNOSIS — E1165 Type 2 diabetes mellitus with hyperglycemia: Secondary | ICD-10-CM | POA: Diagnosis not present

## 2017-05-15 DIAGNOSIS — Z794 Long term (current) use of insulin: Secondary | ICD-10-CM | POA: Diagnosis not present

## 2017-05-15 DIAGNOSIS — D751 Secondary polycythemia: Secondary | ICD-10-CM | POA: Diagnosis not present

## 2017-05-15 MED FILL — TOUJEO SOLOSTAR 300 UNITS/M: 300 | 27 days supply | Qty: 5 | Fill #0

## 2017-05-15 MED FILL — SILDENAFIL CITRATE 100 MG T: 100 | 30 days supply | Qty: 6 | Fill #0

## 2017-05-15 MED FILL — PENTIPS 31G X 5 MM MISC: 31G X 5 MM | 50 days supply | Qty: 100 | Fill #0

## 2017-05-15 MED FILL — TRULICITY 1.5 MG/0.5 ML PEN: 1.5 | 28 days supply | Qty: 2 | Fill #0

## 2017-07-03 MED FILL — PENTIPS 31G X 5 MM MISC: 31G X 5 MM | 50 days supply | Qty: 100 | Fill #1

## 2017-07-03 MED FILL — TOUJEO SOLOSTAR 300 UNITS/M: 300 | 27 days supply | Qty: 5 | Fill #1

## 2017-07-03 MED FILL — TRULICITY 1.5 MG/0.5 ML PEN: 1.5 | 28 days supply | Qty: 2 | Fill #1

## 2017-08-14 DIAGNOSIS — F17200 Nicotine dependence, unspecified, uncomplicated: Secondary | ICD-10-CM | POA: Diagnosis not present

## 2017-08-14 DIAGNOSIS — E291 Testicular hypofunction: Secondary | ICD-10-CM | POA: Diagnosis not present

## 2017-08-14 DIAGNOSIS — E1165 Type 2 diabetes mellitus with hyperglycemia: Secondary | ICD-10-CM | POA: Diagnosis not present

## 2017-08-14 DIAGNOSIS — E559 Vitamin D deficiency, unspecified: Secondary | ICD-10-CM | POA: Diagnosis not present

## 2017-08-14 DIAGNOSIS — Z794 Long term (current) use of insulin: Secondary | ICD-10-CM | POA: Diagnosis not present

## 2017-08-14 DIAGNOSIS — D751 Secondary polycythemia: Secondary | ICD-10-CM | POA: Diagnosis not present

## 2017-08-14 MED FILL — LANTUS SOLOSTAR 100 UNITS/M: 100 | 30 days supply | Qty: 15 | Fill #0

## 2017-08-14 MED FILL — TRULICITY 1.5 MG/0.5 ML PEN: 1.5 | 28 days supply | Qty: 2 | Fill #2

## 2017-09-18 MED FILL — LANTUS SOLOSTAR 100 UNITS/M: 100 | 30 days supply | Qty: 15 | Fill #1

## 2017-09-18 MED FILL — TRULICITY 1.5 MG/0.5 ML PEN: 1.5 | 28 days supply | Qty: 2 | Fill #3

## 2017-09-25 MED FILL — ACCU-CHEK GUIDE STRP: 50 days supply | Qty: 100 | Fill #0

## 2017-09-25 MED FILL — PENTIPS 31G X 5 MM MISC: 31G X 5 MM | 50 days supply | Qty: 100 | Fill #2

## 2017-10-06 MED FILL — AMOXICILLIN 500 MG CAPSULE: 500 | 8 days supply | Qty: 25 | Fill #0

## 2017-10-23 MED FILL — TRULICITY 1.5 MG/0.5 ML PEN: 1.5 | 28 days supply | Qty: 2 | Fill #4

## 2017-10-23 MED FILL — LANTUS SOLOSTAR 100 UNITS/M: 100 | 30 days supply | Qty: 15 | Fill #2

## 2017-11-03 ENCOUNTER — Ambulatory Visit (INDEPENDENT_AMBULATORY_CARE_PROVIDER_SITE_OTHER): Payer: Self-pay | Admitting: Family Medicine

## 2017-11-03 VITALS — BP 122/78 | HR 82 | Temp 98.4°F | Resp 18 | Wt 210.0 lb

## 2017-11-03 DIAGNOSIS — J00 Acute nasopharyngitis [common cold]: Secondary | ICD-10-CM

## 2017-11-03 DIAGNOSIS — H109 Unspecified conjunctivitis: Secondary | ICD-10-CM

## 2017-11-03 DIAGNOSIS — J309 Allergic rhinitis, unspecified: Secondary | ICD-10-CM

## 2017-11-03 DIAGNOSIS — R05 Cough: Secondary | ICD-10-CM

## 2017-11-03 DIAGNOSIS — R059 Cough, unspecified: Secondary | ICD-10-CM

## 2017-11-03 MED ORDER — POLYMYXIN B-TRIMETHOPRIM 10000-0.1 UNIT/ML-% OP SOLN
1.0000 [drp] | Freq: Four times a day (QID) | OPHTHALMIC | 0 refills | Status: DC
Start: 2017-11-03 — End: 2019-01-12

## 2017-11-03 MED ORDER — IPRATROPIUM BROMIDE 0.06 % NA SOLN: 2.0000 | Freq: Four times a day (QID) | NASAL | 0 refills | Status: DC

## 2017-11-03 MED ORDER — BENZONATATE 100 MG PO CAPS: 100.0000 mg | ORAL_CAPSULE | Freq: Three times a day (TID) | ORAL | 0 refills | Status: DC | PRN

## 2017-11-03 MED ORDER — PSEUDOEPH-BROMPHEN-DM 30-2-10 MG/5ML PO SYRP: 10.0000 mL | ORAL_SOLUTION | Freq: Four times a day (QID) | ORAL | 0 refills | Status: DC | PRN

## 2017-11-03 MED FILL — POLYMYXIN B/TMP EYE DROPS: 10000-0.1 | 25 days supply | Qty: 10 | Fill #0

## 2017-11-03 MED FILL — BROMPHENIR-PSEUDOEPHED-DM S: 30-2-10 | 3 days supply | Qty: 120 | Fill #0

## 2017-11-03 MED FILL — IPRATROPIUM 0.06% SPRAY: 0.06 | 21 days supply | Qty: 15 | Fill #0

## 2017-11-03 MED FILL — BENZONATATE 100 MG CAPSULE: 100 | 4 days supply | Qty: 20 | Fill #0

## 2017-11-03 NOTE — Patient Instructions (Addendum)
PLAN< Will consider antibiotic for URI is symptoms persist or are unimproved or worsens (wheezing, fever) due to patient length of symptoms and history of smoking.  Bacterial Conjunctivitis Bacterial conjunctivitis is an infection of your conjunctiva. This is the clear membrane that covers the white part of your eye and the inner surface of your eyelid. This condition can make your eye:  Red or pink.  Itchy.  This condition is caused by bacteria. This condition spreads very easily from person to person (is contagious) and from one eye to the other eye. Follow these instructions at home: Medicines  Take or apply your antibiotic medicine as told by your doctor. Do not stop taking or applying the antibiotic even if you start to feel better.  Take or apply over-the-counter and prescription medicines only as told by your doctor.  Do not touch your eyelid with the eye drop bottle or the ointment tube. Managing discomfort  Wipe any fluid from your eye with a warm, wet washcloth or a cotton ball.  Place a cool, clean washcloth on your eye. Do this for 10-20 minutes, 3-4 times per day. General instructions  Do not wear contact lenses until the irritation is gone. Wear glasses until your doctor says it is okay to wear contacts.  Do not wear eye makeup until your symptoms are gone. Throw away any old makeup.  Change or wash your pillowcase every day.  Do not share towels or washcloths with anyone.  Wash your hands often with soap and water. Use paper towels to dry your hands.  Do not touch or rub your eyes.  Do not drive or use heavy machinery if your vision is blurry. Contact a doctor if:  You have a fever.  Your symptoms do not get better after 10 days. Get help right away if:  You have a fever and your symptoms suddenly get worse.  You have very bad pain when you move your eye.  Your face: ? Hurts. ? Is red. ? Is swollen.  You have sudden loss of vision. This  information is not intended to replace advice given to you by your health care provider. Make sure you discuss any questions you have with your health care provider. Document Released: 03/04/2008 Document Revised: 11/01/2015 Document Reviewed: 03/08/2015 Elsevier Interactive Patient Education  2018 [employer]. Allergic Rhinitis, Adult Allergic rhinitis is an allergic reaction that affects the mucous membrane inside the nose. It causes sneezing, a runny or stuffy nose, and the feeling of mucus going down the back of the throat (postnasal drip). Allergic rhinitis can be mild to severe. There are two types of allergic rhinitis:  Seasonal. This type is also called hay fever. It happens only during certain seasons.  Perennial. This type can happen at any time of the year.  What are the causes? This condition happens when the body's defense system (immune system) responds to certain harmless substances called allergens as though they were germs.  Seasonal allergic rhinitis is triggered by pollen, which can come from grasses, trees, and weeds. Perennial allergic rhinitis may be caused by:  House dust mites.  Pet dander.  Mold spores.  What are the signs or symptoms? Symptoms of this condition include:  Sneezing.  Runny or stuffy nose (nasal congestion).  Postnasal drip.  Itchy nose.  Tearing of the eyes.  Trouble sleeping.  Daytime sleepiness.  How is this diagnosed? This condition may be diagnosed based on:  Your medical history.  A physical exam.  Tests to  check for related conditions, such as: ? Asthma. ? Pink eye. ? Ear infection. ? Upper respiratory infection.  Tests to find out which allergens trigger your symptoms. These may include skin or blood tests.  How is this treated? There is no cure for this condition, but treatment can help control symptoms. Treatment may include:  Taking medicines that block allergy symptoms, such as antihistamines. Medicine may  be given as a shot, nasal spray, or pill.  Avoiding the allergen.  Desensitization. This treatment involves getting ongoing shots until your body becomes less sensitive to the allergen. This treatment may be done if other treatments do not help.  If taking medicine and avoiding the allergen does not work, new, stronger medicines may be prescribed.  Follow these instructions at home:  Find out what you are allergic to. Common allergens include smoke, dust, and pollen.  Avoid the things you are allergic to. These are some things you can do to help avoid allergens: ? Replace carpet with wood, tile, or vinyl flooring. Carpet can trap dander and dust. ? Do not smoke. Do not allow smoking in your home. ? Change your heating and air conditioning filter at least once a month. ? During allergy season:  Keep windows closed as much as possible.  Plan outdoor activities when pollen counts are lowest. This is usually during the evening hours.  When coming indoors, change clothing and shower before sitting on furniture or bedding.  Take over-the-counter and prescription medicines only as told by your health care provider.  Keep all follow-up visits as told by your health care provider. This is important. Contact a health care provider if:  You have a fever.  You develop a persistent cough.  You make whistling sounds when you breathe (you wheeze).  Your symptoms interfere with your normal daily activities. Get help right away if:  You have shortness of breath. Summary  This condition can be managed by taking medicines as directed and avoiding allergens.  Contact your health care provider if you develop a persistent cough or fever.  During allergy season, keep windows closed as much as possible. This information is not intended to replace advice given to you by your health care provider. Make sure you discuss any questions you have with your health care provider. Document Released:  02/18/2001 Document Revised: 07/03/2016 Document Reviewed: 07/03/2016 Elsevier Interactive Patient Education  [employer].

## 2017-11-03 NOTE — Progress Notes (Signed)
[name] is a 52 y.o. male who presents today with concerns of eye redness and a sore throat and cold like symptoms for the last 7 days. Patient has a 58 year old daughter who is well and away at summer camp. He works in [employer]ing [employer] and does not report household or work contacts with similar symptoms.  Review of Systems  Constitutional: Negative for chills, fever and malaise/fatigue.  HENT: Positive for congestion, sinus pain and sore throat. Negative for ear discharge and ear pain.   Eyes: Positive for discharge and redness.  Respiratory: Positive for cough. Negative for sputum production and shortness of breath.   Cardiovascular: Negative.  Negative for chest pain.  Gastrointestinal: Negative for abdominal pain, diarrhea, nausea and vomiting.  Genitourinary: Negative for dysuria, frequency, hematuria and urgency.  Musculoskeletal: Negative for myalgias.  Skin: Negative.   Neurological: Negative for headaches.  Endo/Heme/Allergies: Negative.   Psychiatric/Behavioral: Negative.     O: Vitals:   11/03/17 0905  BP: 122/78  Pulse: 82  Resp: 18  Temp: 98.4 F (36.9 C)      Physical Exam  Constitutional: He is oriented to person, place, and time. Vital signs are normal. He appears well-developed and well-nourished. He is active.  Non-toxic appearance. He does not have a sickly appearance.  HENT:  Head: Normocephalic.  Right Ear: Hearing, external ear and ear canal normal. A middle ear effusion is present.  Left Ear: Hearing, tympanic membrane, external ear and ear canal normal.  Nose: Rhinorrhea present.  Mouth/Throat: Uvula is midline. Posterior oropharyngeal erythema present.  Neck: Normal range of motion. Neck supple.  Cardiovascular: Normal rate, regular rhythm, normal heart sounds and normal pulses.  Pulmonary/Chest: Effort normal and breath sounds normal. He has no wheezes. He has no rhonchi. He has no rales.  Abdominal: Soft. Bowel sounds are normal.  Musculoskeletal:  Normal range of motion.  Lymphadenopathy:       Head (right side): No submental and no submandibular adenopathy present.       Head (left side): No submental and no submandibular adenopathy present.    He has no cervical adenopathy.  Neurological: He is alert and oriented to person, place, and time.  Psychiatric: He has a normal mood and affect.  Vitals reviewed. Patient with overall normal physical exam related to symptoms. Not current or hx of fever, no evidence of lymphadenopathy, nor oropharynx exudate. Dry non productive cough observed on exam-   A: 1. Conjunctivitis of both eyes, unspecified conjunctivitis type   2. Nasopharyngitis   3. Allergic rhinitis, unspecified seasonality, unspecified trigger   4. Cough      P:    PLAN< Will consider antibiotic for URI is symptoms persist or are unimproved or worsens (wheezing, fever) due to patient length of symptoms and history of smoking. Discussed findings and plan for symptomatic care only to allow body to take care of discomfort that is most likely viral- patient does has risk factors of smoking and DM 2. Will call at 48 hour mark and patient provided direct contact if symptoms worsen. Discussed the need for increased hydration, oral throat lozenges, and salt water gargles.  Exam findings, diagnosis etiology and medication use and indications reviewed with patient. Follow- Up and discharge instructions provided. No emergent/urgent issues found on exam.  Patient verbalized understanding of information provided and agrees with plan of care (POC), all questions answered.  1. Conjunctivitis of both eyes, unspecified conjunctivitis type - trimethoprim-polymyxin b (POLYTRIM) ophthalmic solution; Place 1 drop into both  eyes every 6 (six) hours.  2. Nasopharyngitis - brompheniramine-pseudoephedrine-DM 30-2-10 MG/5ML syrup; Take 10 mLs by mouth 4 (four) times daily as needed.  3. Allergic rhinitis, unspecified seasonality, unspecified  trigger - ipratropium (ATROVENT) 0.06 % nasal spray; Place 2 sprays into both nostrils 4 (four) times daily.  4. Cough - brompheniramine-pseudoephedrine-DM 30-2-10 MG/5ML syrup; Take 10 mLs by mouth 4 (four) times daily as needed. - benzonatate (TESSALON) 100 MG capsule; Take 1-2 capsules (100-200 mg total) by mouth 3 (three) times daily as needed for cough.

## 2017-11-16 DIAGNOSIS — E1165 Type 2 diabetes mellitus with hyperglycemia: Secondary | ICD-10-CM | POA: Diagnosis not present

## 2017-11-16 DIAGNOSIS — E1169 Type 2 diabetes mellitus with other specified complication: Secondary | ICD-10-CM | POA: Diagnosis not present

## 2017-11-16 DIAGNOSIS — E559 Vitamin D deficiency, unspecified: Secondary | ICD-10-CM | POA: Diagnosis not present

## 2017-11-16 DIAGNOSIS — Z794 Long term (current) use of insulin: Secondary | ICD-10-CM | POA: Diagnosis not present

## 2017-11-16 DIAGNOSIS — E291 Testicular hypofunction: Secondary | ICD-10-CM | POA: Diagnosis not present

## 2017-11-20 DIAGNOSIS — Z794 Long term (current) use of insulin: Secondary | ICD-10-CM | POA: Diagnosis not present

## 2017-11-20 DIAGNOSIS — E559 Vitamin D deficiency, unspecified: Secondary | ICD-10-CM | POA: Diagnosis not present

## 2017-11-20 DIAGNOSIS — F17200 Nicotine dependence, unspecified, uncomplicated: Secondary | ICD-10-CM | POA: Diagnosis not present

## 2017-11-20 DIAGNOSIS — E291 Testicular hypofunction: Secondary | ICD-10-CM | POA: Diagnosis not present

## 2017-11-20 DIAGNOSIS — E1165 Type 2 diabetes mellitus with hyperglycemia: Secondary | ICD-10-CM | POA: Diagnosis not present

## 2017-11-20 MED FILL — TRULICITY 1.5 MG/0.5 ML PEN: 1.5 | 28 days supply | Qty: 2 | Fill #5

## 2017-11-20 MED FILL — ACCU-CHEK GUIDE STRP: 50 days supply | Qty: 100 | Fill #1

## 2017-11-20 MED FILL — LANTUS SOLOSTAR 100 UNITS/M: 100 | 30 days supply | Qty: 15 | Fill #3

## 2017-12-24 MED FILL — LANTUS SOLOSTAR 100 UNITS/M: 100 | 30 days supply | Qty: 15 | Fill #0

## 2017-12-24 MED FILL — TRULICITY 1.5 MG/0.5 ML PEN: 1.5 | 28 days supply | Qty: 2 | Fill #0

## 2017-12-25 MED FILL — ACCU-CHEK FASTCLIX LANCETS: 51 days supply | Qty: 102 | Fill #0

## 2018-01-07 ENCOUNTER — Telehealth: Payer: Self-pay | Admitting: Medical

## 2018-01-07 ENCOUNTER — Ambulatory Visit: Payer: 59 | Admitting: Medical

## 2018-01-07 ENCOUNTER — Encounter: Payer: Self-pay | Admitting: Medical

## 2018-01-07 ENCOUNTER — Ambulatory Visit (HOSPITAL_BASED_OUTPATIENT_CLINIC_OR_DEPARTMENT_OTHER)
Admission: RE | Admit: 2018-01-07 | Discharge: 2018-01-07 | Disposition: A | Discharge status: Home / Self Care | Payer: 59 | Source: Ambulatory Visit | Attending: Medical | Admitting: Medical

## 2018-01-07 VITALS — BP 117/78 | HR 87 | Temp 98.2°F | Resp 16 | Ht 71.0 in | Wt 213.8 lb

## 2018-01-07 DIAGNOSIS — R059 Cough, unspecified: Secondary | ICD-10-CM

## 2018-01-07 DIAGNOSIS — R05 Cough: Secondary | ICD-10-CM

## 2018-01-07 DIAGNOSIS — F17200 Nicotine dependence, unspecified, uncomplicated: Secondary | ICD-10-CM | POA: Diagnosis not present

## 2018-01-07 DIAGNOSIS — J40 Bronchitis, not specified as acute or chronic: Secondary | ICD-10-CM

## 2018-01-07 DIAGNOSIS — Z122 Encounter for screening for malignant neoplasm of respiratory organs: Secondary | ICD-10-CM

## 2018-01-07 DIAGNOSIS — J329 Chronic sinusitis, unspecified: Secondary | ICD-10-CM

## 2018-01-07 MED ORDER — VARENICLINE TARTRATE 0.5 MG X 11 & 1 MG X 42 PO MISC: ORAL | 0 refills | Status: DC

## 2018-01-07 MED ORDER — DOXYCYCLINE HYCLATE 100 MG PO TABS: 100.0000 mg | ORAL_TABLET | Freq: Two times a day (BID) | ORAL | 0 refills | Status: DC

## 2018-01-07 MED ORDER — ALBUTEROL SULFATE HFA 108 (90 BASE) MCG/ACT IN AERS: 2.0000 | INHALATION_SPRAY | Freq: Four times a day (QID) | RESPIRATORY_TRACT | 2 refills | Status: DC | PRN

## 2018-01-07 MED ORDER — HYDROCODONE-HOMATROPINE 5-1.5 MG/5ML PO SYRP: ORAL_SOLUTION | ORAL | 0 refills | Status: DC

## 2018-01-07 MED FILL — DOXYCYCLINE HYCLATE 100 MG: 100 | 10 days supply | Qty: 20 | Fill #0

## 2018-01-07 MED FILL — VENTOLIN HFA 90 MCG INHALER: 108 (90 BAS | 25 days supply | Qty: 18 | Fill #0

## 2018-01-07 MED FILL — CHANTIX STARTING MONTH BOX: 0.5 MG X 11 | 30 days supply | Qty: 53 | Fill #0

## 2018-01-07 MED FILL — HYDROCODONE-HOMATROPINE SOL: 5-1.5 | 5 days supply | Qty: 100 | Fill #0

## 2018-01-07 NOTE — Progress Notes (Addendum)
Subjective:    Patient ID: [name], male    DOB: Feb 12, 1966, 52 y.o.   MRN: [id number]  HPI  Pt in with about 5 days of nasal congestion, chest congestion, mild sinus pressure, mild sinus pressure, subjective fever and productive cough. Pt is a smoker.  He stopped and restarted after extreme stress.  Hx of started smoking since 2. On discussion he describe smoking about a pack a day.    Review of Systems  Constitutional: Positive for fever. Negative for chills and fatigue.  HENT: Positive for congestion, sinus pressure and sinus pain.   Respiratory: Positive for cough. Negative for shortness of breath and wheezing.   Cardiovascular: Negative for chest pain and palpitations.  Gastrointestinal: Negative for abdominal pain.  Genitourinary: Negative for dysuria, flank pain and frequency.  Musculoskeletal: Negative for back pain.  Skin: Negative for rash.  Neurological: Negative for dizziness, speech difficulty, weakness and headaches.  Hematological: Negative for adenopathy. Does not bruise/bleed easily.  Psychiatric/Behavioral: Negative for behavioral problems and confusion.    Past Medical History:  Diagnosis Date  . Diabetes mellitus without complication [facility])      Social History   Socioeconomic History  . Marital status: Married    Spouse name: Not on file  . Number of children: Not on file  . Years of education: Not on file  . Highest education level: Not on file  Occupational History  . Not on file  Social Needs  . Financial resource strain: Not on file  . Food insecurity:    Worry: Not on file    Inability: Not on file  . Transportation needs:    Medical: Not on file    Non-medical: Not on file  Tobacco Use  . Smoking status: Current Every Day Smoker    Packs/day: 1.00    Years: 30.00    Pack years: 30.00    Types: Cigarettes    Start date: 10/29/1984  . Smokeless tobacco: Never Used  Substance and Sexual Activity  . Alcohol use: No   Alcohol/week: 0.0 oz  . Drug use: No  . Sexual activity: Yes  Lifestyle  . Physical activity:    Days per week: Not on file    Minutes per session: Not on file  . Stress: Not on file  Relationships  . Social connections:    Talks on phone: Not on file    Gets together: Not on file    Attends religious service: Not on file    Active member of club or organization: Not on file    Attends meetings of clubs or organizations: Not on file    Relationship status: Not on file  . Intimate partner violence:    Fear of current or ex partner: Not on file    Emotionally abused: Not on file    Physically abused: Not on file    Forced sexual activity: Not on file  Other Topics Concern  . Not on file  Social History Narrative  . Not on file    Past Surgical History:  Procedure Laterality Date  . APPENDECTOMY      Family History  Problem Relation Age of Onset  . Diabetes Maternal Grandmother   . Arthritis Maternal Grandmother   . Arthritis Maternal Grandfather   . Arthritis Paternal Grandmother   . Arthritis Paternal Grandfather     No Known Allergies  Current Outpatient Medications on File Prior to Visit  Medication Sig Dispense Refill  . aspirin EC 81  MG tablet Take 81 mg by mouth daily. Reported on 07/04/2015    . benzonatate (TESSALON) 100 MG capsule Take 1-2 capsules (100-200 mg total) by mouth 3 (three) times daily as needed for cough. 20 capsule 0  . brompheniramine-pseudoephedrine-DM 30-2-10 MG/5ML syrup Take 10 mLs by mouth 4 (four) times daily as needed. 120 mL 0  . cyclobenzaprine (FLEXERIL) 10 MG tablet Take 1 tablet (10 mg total) by mouth at bedtime. 10 tablet 0  . diclofenac (VOLTAREN) 75 MG EC tablet Take 1 tablet (75 mg total) by mouth 2 (two) times daily. 30 tablet 0  . diclofenac (VOLTAREN) 75 MG EC tablet Take 1 tablet (75 mg total) by mouth 2 (two) times daily. 30 tablet 0  . glucose blood (TRUE METRIX BLOOD GLUCOSE TEST) test strip Use to check blood sugars twice  daily    . Insulin Glargine (TOUJEO SOLOSTAR) 300 UNIT/ML SOPN Inject 50 Units into the skin daily.    . Insulin Pen Needle (B-D UF III MINI PEN NEEDLES) 31G X 5 MM MISC USE TO INJECT INSULIN DAILY AS DIRECTED.    Marland Kitchen ipratropium (ATROVENT) 0.06 % nasal spray Place 2 sprays into both nostrils 4 (four) times daily. 15 mL 0  . metFORMIN (GLUCOPHAGE) 500 MG tablet Take 500 mg by mouth 2 (two) times daily with a meal.    . nicotine (RA NICOTINE) 14 mg/24hr patch Place 1 patch (14 mg total) onto the skin daily. 28 patch 1  . sildenafil (VIAGRA) 100 MG tablet Take 100 mg by mouth once as needed.    . sildenafil (VIAGRA) 100 MG tablet Take 1 tablet (100 mg total) by mouth daily as needed for erectile dysfunction. Max one tab in any 24 hour period. 10 tablet 0  . Testosterone (ANDROGEL) 20.25 MG/1.25GM (1.62%) GEL Apply the contents of two packets to the upper body once daily    . traMADol (ULTRAM) 50 MG tablet Take 1 tablet (50 mg total) by mouth every 6 (six) hours as needed. 6 tablet 0  . trimethoprim-polymyxin b (POLYTRIM) ophthalmic solution Place 1 drop into both eyes every 6 (six) hours. 10 mL 0  . TRULICITY 1.5 MG/0.5ML SOPN   5   No current facility-administered medications on file prior to visit.     BP 117/78   Pulse 87   Temp 98.2 F (36.8 C) (Oral)   Resp 16   Ht 5\' 11"  (1.803 m)   Wt 213 lb 12.8 oz (97 kg)   SpO2 98%   BMI 29.82 kg/m       Objective:   Physical Exam  General  Mental Status - Alert. General Appearance - Well groomed. Not in acute distress.  Skin Rashes- No Rashes.  HEENT Head- Normal. Ear Auditory Canal - Left- Normal. Right - Normal.Tympanic Membrane- Left- Normal. Right- Normal. Eye Sclera/Conjunctiva- Left- Normal. Right- Normal. Nose & Sinuses Nasal Mucosa- Left-  Boggy and Congested. Right-  Boggy and  Congested.Bilateral maxillary and frontal sinus pressure. Mouth & Throat Lips: Upper Lip- Normal: no dryness, cracking, pallor, cyanosis, or  vesicular eruption. Lower Lip-Normal: no dryness, cracking, pallor, cyanosis or vesicular eruption. Buccal Mucosa- Bilateral- No Aphthous ulcers. Oropharynx- No Discharge or Erythema. Tonsils: Characteristics- Bilateral- No Erythema or Congestion. Size/Enlargement- Bilateral- No enlargement. Discharge- bilateral-None.  Neck Neck- Supple. No Masses.   Chest and Lung Exam Auscultation: Breath Sounds:-Clear even and unlabored.  Cardiovascular Auscultation:Rythm- Regular, rate and rhythm. Murmurs & Other Heart Sounds:Ausculatation of the heart reveal- No Murmurs.  Lymphatic Head &  Neck General Head & Neck Lymphatics: Bilateral: Description- No Localized lymphadenopathy.       Assessment & Plan:   You appear to have bronchitis and sinusitis. Rest hydrate and tylenol for fever. I am prescribing cough medicine hycodan and doxycycline  antibiotic.   For wheezing rx albuterol inhaler.  For smoking refill chantix  Will get chest xray.  Follow up in 7-10 days or as needed  [employer], [employer]

## 2018-01-07 NOTE — Patient Instructions (Addendum)
You appear to have bronchitis and sinusitis. Rest hydrate and tylenol for fever. I am prescribing cough medicine hycodan and doxycycline  antibiotic.   For wheezing rx albuterol inhaler.  For smoking refill chantix  Will get chest xray today.  Follow up in 7-10 days or as needed

## 2018-01-07 NOTE — Telephone Encounter (Signed)
Ct screening for lung cancer placed.

## 2018-01-28 MED FILL — PENTIPS 31G X 5 MM MISC: 31G X 5 MM | 50 days supply | Qty: 100 | Fill #3

## 2018-01-28 MED FILL — TRULICITY 1.5 MG/0.5 ML PEN: 1.5 | 28 days supply | Qty: 2 | Fill #1

## 2018-01-28 MED FILL — ACCU-CHEK GUIDE STRP: 50 days supply | Qty: 100 | Fill #2

## 2018-03-30 MED FILL — ACCU-CHEK FASTCLIX LANCETS: 51 days supply | Qty: 102 | Fill #0

## 2018-04-08 DIAGNOSIS — E1165 Type 2 diabetes mellitus with hyperglycemia: Secondary | ICD-10-CM | POA: Diagnosis not present

## 2018-04-08 DIAGNOSIS — F17200 Nicotine dependence, unspecified, uncomplicated: Secondary | ICD-10-CM | POA: Diagnosis not present

## 2018-04-08 DIAGNOSIS — E559 Vitamin D deficiency, unspecified: Secondary | ICD-10-CM | POA: Diagnosis not present

## 2018-04-08 DIAGNOSIS — E291 Testicular hypofunction: Secondary | ICD-10-CM | POA: Diagnosis not present

## 2018-04-08 MED FILL — LANTUS SOLOSTAR 100 UNITS/M: 100 | 90 days supply | Qty: 45 | Fill #0

## 2018-04-08 MED FILL — MELOXICAM 15 MG TABLET: 15 | 30 days supply | Qty: 30 | Fill #0

## 2018-04-08 MED FILL — TRULICITY 1.5 MG/0.5 ML PEN: 1.5 | 84 days supply | Qty: 6 | Fill #0

## 2018-04-08 MED FILL — metFORMIN HCL ER 500 MG TB2: 500 | 90 days supply | Qty: 180 | Fill #0

## 2018-04-08 MED FILL — CYCLOBENZAPRINE HCL 10 MG T: 10 | 30 days supply | Qty: 60 | Fill #0

## 2018-04-15 MED FILL — TESTOSTERONE 20.25 MG/ACT (: 20.25 MG/AC | 90 days supply | Qty: 225 | Fill #0

## 2018-04-16 MED FILL — CICLOPIROX 8% SOLUTION: 8 | 30 days supply | Qty: 7 | Fill #0

## 2018-07-16 DIAGNOSIS — E1165 Type 2 diabetes mellitus with hyperglycemia: Secondary | ICD-10-CM | POA: Diagnosis not present

## 2018-07-16 DIAGNOSIS — E559 Vitamin D deficiency, unspecified: Secondary | ICD-10-CM | POA: Diagnosis not present

## 2018-07-16 DIAGNOSIS — F17200 Nicotine dependence, unspecified, uncomplicated: Secondary | ICD-10-CM | POA: Diagnosis not present

## 2018-07-16 DIAGNOSIS — E291 Testicular hypofunction: Secondary | ICD-10-CM | POA: Diagnosis not present

## 2018-07-16 MED FILL — PENTIPS 31G X 5 MM MISC: 31G X 5 MM | 90 days supply | Qty: 100 | Fill #0

## 2018-07-16 MED FILL — ACCU-CHEK GUIDE TEST STRIP: 30 days supply | Qty: 100 | Fill #0

## 2018-07-16 MED FILL — ACCU-CHEK FASTCLIX LANCETS: 30 days supply | Qty: 102 | Fill #0

## 2018-07-16 MED FILL — TESTOSTERONE 20.25 MG/ACT (: 20.25 MG/AC | 90 days supply | Qty: 225 | Fill #0

## 2018-08-18 MED FILL — LANTUS SOLOSTAR 100 UNITS/M: 100 | 90 days supply | Qty: 45 | Fill #0

## 2018-09-03 DIAGNOSIS — E559 Vitamin D deficiency, unspecified: Secondary | ICD-10-CM | POA: Diagnosis not present

## 2018-09-03 DIAGNOSIS — F17200 Nicotine dependence, unspecified, uncomplicated: Secondary | ICD-10-CM | POA: Diagnosis not present

## 2018-09-03 DIAGNOSIS — E1165 Type 2 diabetes mellitus with hyperglycemia: Secondary | ICD-10-CM | POA: Diagnosis not present

## 2018-09-03 DIAGNOSIS — E291 Testicular hypofunction: Secondary | ICD-10-CM | POA: Diagnosis not present

## 2018-09-03 MED FILL — SILDENAFIL CITRATE 100 MG T: 100 | 30 days supply | Qty: 6 | Fill #0

## 2018-10-15 DIAGNOSIS — E559 Vitamin D deficiency, unspecified: Secondary | ICD-10-CM | POA: Diagnosis not present

## 2018-10-15 DIAGNOSIS — E291 Testicular hypofunction: Secondary | ICD-10-CM | POA: Diagnosis not present

## 2018-10-15 DIAGNOSIS — E1165 Type 2 diabetes mellitus with hyperglycemia: Secondary | ICD-10-CM | POA: Diagnosis not present

## 2018-10-15 DIAGNOSIS — B351 Tinea unguium: Secondary | ICD-10-CM | POA: Diagnosis not present

## 2018-10-15 MED FILL — HUMALOG MIX 75-25 KWIKPEN: (75-25) 100 | 27 days supply | Qty: 15 | Fill #0

## 2018-11-02 ENCOUNTER — Other Ambulatory Visit: Payer: Self-pay

## 2018-11-02 ENCOUNTER — Encounter: Payer: Self-pay | Admitting: Medical

## 2018-11-02 ENCOUNTER — Ambulatory Visit (INDEPENDENT_AMBULATORY_CARE_PROVIDER_SITE_OTHER): Payer: 59 | Admitting: Medical

## 2018-11-02 VITALS — Ht 71.0 in | Wt 208.0 lb

## 2018-11-02 DIAGNOSIS — M25512 Pain in left shoulder: Secondary | ICD-10-CM | POA: Diagnosis not present

## 2018-11-02 MED ORDER — TRAMADOL HCL 50 MG PO TABS: 50.0000 mg | ORAL_TABLET | Freq: Three times a day (TID) | ORAL | 0 refills | Status: AC | PRN

## 2018-11-02 MED ORDER — CYCLOBENZAPRINE HCL 10 MG PO TABS: 10.0000 mg | ORAL_TABLET | Freq: Every day | ORAL | 0 refills | Status: DC

## 2018-11-02 MED ORDER — DICLOFENAC SODIUM 75 MG PO TBEC: 75.0000 mg | DELAYED_RELEASE_TABLET | Freq: Two times a day (BID) | ORAL | 0 refills | Status: DC

## 2018-11-02 MED FILL — DICLOFENAC SODIUM 75 MG TAB: 75 | 15 days supply | Qty: 30 | Fill #0

## 2018-11-02 MED FILL — traMADol HCL 50 MG TABS: 50 | 5 days supply | Qty: 15 | Fill #0

## 2018-11-02 MED FILL — CYCLOBENZAPRINE HCL 10 MG T: 10 | 10 days supply | Qty: 10 | Fill #0

## 2018-11-02 NOTE — Progress Notes (Signed)
Subjective:    Patient ID: [name], male    DOB: 08/04/1965, 53 y.o.   MRN: [id number]  HPI  Virtual Visit via Video Note  I connected with Isaac Critchleylwin Craig Georgiou on 11/02/18 at  4:40 PM EDT by a video enabled telemedicine application and verified that I am speaking with the correct person using two identifiers.  Location: Patient: home Provider: home.   I discussed the limitations of evaluation and management by telemedicine and the availability of in person appointments. The patient expressed understanding and agreed to proceed.  History of Present Illness:  Pt has been working from home.  He has been working remotely. He states left shoulder hurting about 4 months. He remember working out and next day has posterior shoulder and lateral aspect of shoulder is worse. Pain will sometimes shoot all the way to his left  hand. Sometimes when he sleeps will have pain. When tilts head to rt will have left shoulder pain. Pt states hard to sleep due to pain.  Some rt trapezius area pain at time. Trapezius pain seems to improve with massage.    Observations/Objective: General-no acute distress, pleasant, oriented. Lungs- on inspection lungs appear unlabored. Neck- no tracheal deviation or jvd on inspection. Left side trapezius tenderness to palpation. Tilting head to rt causes left shoulder pain. Neuro- gross motor function appears intact. Left shoulder- lateral aspect tender to palpation. Pain on attempted abduction. Can lift arm above shoulder level without moderate to sever pain   Assessment and Plan: For recent left shoulder and trapezius pain for 4 months, I prescribed diclofenac, flexeril and tramadol. Due to prolonged nature of pain went ahead and placed sport med referral. Will try to get you in this Friday.  Also will try to get you scheduled for Wellness exam/cpe on December 26, 2018.  I did discuss with patient to be careful with his transient brief sharp pain every  other day. If changes or worsens then be seen at [facility] ED. Pt expressed understanding.   Follow Up Instructions:    I discussed the assessment and treatment plan with the patient. The patient was provided an opportunity to ask questions and all were answered. The patient agreed with the plan and demonstrated an understanding of the instructions.   The patient was advised to call back or seek an in-person evaluation if the symptoms worsen or if the condition fails to improve as anticipated.     Esperanza RichtersEdward Niels Cranshaw, PA-C    Review of Systems  Constitutional: Negative for chills, fatigue and fever.  Respiratory: Negative for chest tightness, shortness of breath and wheezing.   Cardiovascular: Negative for chest pain and palpitations.       Transient sharp chest pain that last for seconds then goes away. Feels pain every other day.  Not associated with shoulder. None present today.  Gastrointestinal: Negative for abdominal pain.  Musculoskeletal: Negative for back pain and joint swelling.       Shoulder pain. See hpi.  Skin: Negative for rash.  Neurological: Negative for dizziness and headaches.  Hematological: Negative for adenopathy. Does not bruise/bleed easily.  Psychiatric/Behavioral: Negative for behavioral problems and confusion.     Past Medical History:  Diagnosis Date  . Diabetes mellitus without complication [facility](HCC)      Social History   Socioeconomic History  . Marital status: Married    Spouse name: Not on file  . Number of children: Not on file  . Years of education: Not on file  .  Highest education level: Not on file  Occupational History  . Not on file  Social Needs  . Financial resource strain: Not on file  . Food insecurity:    Worry: Not on file    Inability: Not on file  . Transportation needs:    Medical: Not on file    Non-medical: Not on file  Tobacco Use  . Smoking status: Current Every Day Smoker    Packs/day: 1.00    Years: 30.00    Pack  years: 30.00    Types: Cigarettes    Start date: 10/29/1984  . Smokeless tobacco: Never Used  Substance and Sexual Activity  . Alcohol use: No    Alcohol/week: 0.0 standard drinks  . Drug use: No  . Sexual activity: Yes  Lifestyle  . Physical activity:    Days per week: Not on file    Minutes per session: Not on file  . Stress: Not on file  Relationships  . Social connections:    Talks on phone: Not on file    Gets together: Not on file    Attends religious service: Not on file    Active member of club or organization: Not on file    Attends meetings of clubs or organizations: Not on file    Relationship status: Not on file  . Intimate partner violence:    Fear of current or ex partner: Not on file    Emotionally abused: Not on file    Physically abused: Not on file    Forced sexual activity: Not on file  Other Topics Concern  . Not on file  Social History Narrative  . Not on file    Past Surgical History:  Procedure Laterality Date  . APPENDECTOMY      Family History  Problem Relation Age of Onset  . Diabetes Maternal Grandmother   . Arthritis Maternal Grandmother   . Arthritis Maternal Grandfather   . Arthritis Paternal Grandmother   . Arthritis Paternal Grandfather     No Known Allergies  Current Outpatient Medications on File Prior to Visit  Medication Sig Dispense Refill  . albuterol (PROVENTIL HFA;VENTOLIN HFA) 108 (90 Base) MCG/ACT inhaler Inhale 2 puffs into the lungs every 6 (six) hours as needed for wheezing or shortness of breath. 1 Inhaler 2  . aspirin EC 81 MG tablet Take 81 mg by mouth daily. Reported on 07/04/2015    . diclofenac (VOLTAREN) 75 MG EC tablet Take 1 tablet (75 mg total) by mouth 2 (two) times daily. 30 tablet 0  . diclofenac (VOLTAREN) 75 MG EC tablet Take 1 tablet (75 mg total) by mouth 2 (two) times daily. 30 tablet 0  . glucose blood (TRUE METRIX BLOOD GLUCOSE TEST) test strip Use to check blood sugars twice daily    . HUMALOG MIX  75/25 KWIKPEN (75-25) 100 UNIT/ML Kwikpen     . Insulin Glargine (TOUJEO SOLOSTAR) 300 UNIT/ML SOPN Inject 50 Units into the skin daily.    . Insulin Pen Needle (B-D UF III MINI PEN NEEDLES) 31G X 5 MM MISC USE TO INJECT INSULIN DAILY AS DIRECTED.    Marland Kitchen ipratropium (ATROVENT) 0.06 % nasal spray Place 2 sprays into both nostrils 4 (four) times daily. 15 mL 0  . metFORMIN (GLUCOPHAGE) 500 MG tablet Take 500 mg by mouth 2 (two) times daily with a meal.    . nicotine (RA NICOTINE) 14 mg/24hr patch Place 1 patch (14 mg total) onto the skin daily. 28 patch 1  .  sildenafil (VIAGRA) 100 MG tablet Take 100 mg by mouth once as needed.    . sildenafil (VIAGRA) 100 MG tablet Take 1 tablet (100 mg total) by mouth daily as needed for erectile dysfunction. Max one tab in any 24 hour period. 10 tablet 0  . Testosterone (ANDROGEL) 20.25 MG/1.25GM (1.62%) GEL Apply the contents of two packets to the upper body once daily    . traMADol (ULTRAM) 50 MG tablet Take 1 tablet (50 mg total) by mouth every 6 (six) hours as needed. 6 tablet 0  . trimethoprim-polymyxin b (POLYTRIM) ophthalmic solution Place 1 drop into both eyes every 6 (six) hours. 10 mL 0  . varenicline (CHANTIX PAK) 0.5 MG X 11 & 1 MG X 42 tablet Take one 0.5 mg tablet by mouth once daily for 3 days, then increase to one 0.5 mg tablet twice daily for 4 days, then increase to one 1 mg tablet twice daily. 53 tablet 0   No current facility-administered medications on file prior to visit.     Ht 5\' 11"  (1.803 m)   Wt 208 lb (94.3 kg)   BMI 29.01 kg/m       Objective:   Physical Exam        Assessment & Plan:

## 2018-11-02 NOTE — Patient Instructions (Addendum)
For recent left shoulder and trapezius pain for 4 months, I prescribed diclofenac, flexeril and tramadol. Due to prolonged nature of pain went ahead and placed sport med referral. Will try to get you in this Friday.  Also will try to get you scheduled for Wellness exam/cpe on December 26, 2018.  I did discuss with patient to be careful with his transient brief sharp pain every other day. If changes or worsens then be seen at [facility] ED. Pt expressed understanding.

## 2018-11-04 ENCOUNTER — Encounter: Payer: Self-pay | Admitting: Family Medicine

## 2018-11-04 ENCOUNTER — Other Ambulatory Visit: Payer: Self-pay

## 2018-11-04 ENCOUNTER — Ambulatory Visit: Payer: 59 | Admitting: Family Medicine

## 2018-11-04 ENCOUNTER — Ambulatory Visit: Payer: Self-pay

## 2018-11-04 VITALS — BP 141/84 | HR 95 | Ht 71.0 in | Wt 205.0 lb

## 2018-11-04 DIAGNOSIS — G8929 Other chronic pain: Secondary | ICD-10-CM

## 2018-11-04 DIAGNOSIS — M7502 Adhesive capsulitis of left shoulder: Secondary | ICD-10-CM | POA: Insufficient documentation

## 2018-11-04 DIAGNOSIS — M25512 Pain in left shoulder: Secondary | ICD-10-CM | POA: Diagnosis not present

## 2018-11-04 MED ORDER — METHYLPREDNISOLONE ACETATE 40 MG/ML IJ SUSP
40.0000 mg | Freq: Once | INTRAMUSCULAR | Status: AC
  Administered 2018-11-04: 40 mg via INTRA_ARTICULAR

## 2018-11-04 NOTE — Progress Notes (Signed)
[name] - 53 y.o. male MRN [id number]  Date of birth: 11-21-1965  SUBJECTIVE:  Including CC & ROS.  Chief Complaint  Patient presents with  . Shoulder Pain    left shoulder x 3 months    [name] is a 53 y.o. male that is presenting with left shoulder pain.  This is acute on chronic in nature.  It seems of gotten worse over the past month.  The pain has been occurring since January.  He feels that he hurt his shoulder while playing basketball in January.  The pain is localized to the lateral aspect of the shoulder.  He has been mild to severe in nature.  It is sharp and stabbing.  It is worse with abduction movements.  No history of shoulder surgery.  He works in Consulting civil engineer.[employer]    Review of Systems  Constitutional: Negative for fever.  HENT: Negative for congestion.   Respiratory: Negative for cough.   Cardiovascular: Negative for chest pain.  Gastrointestinal: Negative for abdominal pain.  Musculoskeletal: Negative for gait problem.  Skin: Negative for color change.  Neurological: Negative for weakness.  Hematological: Negative for adenopathy.    HISTORY: Past Medical, Surgical, Social, and Family History Reviewed & Updated per EMR.   Pertinent Historical Findings include:  Past Medical History:  Diagnosis Date  . Diabetes mellitus without complication [facility])     Past Surgical History:  Procedure Laterality Date  . APPENDECTOMY      No Known Allergies  Family History  Problem Relation Age of Onset  . Diabetes Maternal Grandmother   . Arthritis Maternal Grandmother   . Arthritis Maternal Grandfather   . Arthritis Paternal Grandmother   . Arthritis Paternal Grandfather      Social History   Socioeconomic History  . Marital status: Married    Spouse name: Not on file  . Number of children: Not on file  . Years of education: Not on file  . Highest education level: Not on file  Occupational History  . Not on file  Social Needs  . Financial  resource strain: Not on file  . Food insecurity:    Worry: Not on file    Inability: Not on file  . Transportation needs:    Medical: Not on file    Non-medical: Not on file  Tobacco Use  . Smoking status: Current Every Day Smoker    Packs/day: 1.00    Years: 30.00    Pack years: 30.00    Types: Cigarettes    Start date: 10/29/1984  . Smokeless tobacco: Never Used  Substance and Sexual Activity  . Alcohol use: No    Alcohol/week: 0.0 standard drinks  . Drug use: No  . Sexual activity: Yes  Lifestyle  . Physical activity:    Days per week: Not on file    Minutes per session: Not on file  . Stress: Not on file  Relationships  . Social connections:    Talks on phone: Not on file    Gets together: Not on file    Attends religious service: Not on file    Active member of club or organization: Not on file    Attends meetings of clubs or organizations: Not on file    Relationship status: Not on file  . Intimate partner violence:    Fear of current or ex partner: Not on file    Emotionally abused: Not on file    Physically abused: Not on file  Forced sexual activity: Not on file  Other Topics Concern  . Not on file  Social History Narrative  . Not on file     PHYSICAL EXAM:  VS: BP (!) 141/84   Pulse 95   Ht 5\' 11"  (1.803 m)   Wt 205 lb (93 kg)   BMI 28.59 kg/m  Physical Exam Gen: NAD, alert, cooperative with exam, well-appearing ENT: normal lips, normal nasal mucosa,  Eye: normal EOM, normal conjunctiva and lids CV:  no edema, +2 pedal pulses   Resp: no accessory muscle use, non-labored,   Skin: no rashes, no areas of induration  Neuro: normal tone, normal sensation to touch Psych:  normal insight, alert and oriented MSK:  Left shoulder:  No TTP of the AC joint  Normal active flexion and abduction  Normal IR  Some stiffness with ER but normal ROM  Normal strength to resistance with IR and ER  Pain with empty can testing  Normal O'Brien's testing  Pain  with abduction and ER  Neurovascularly intact   Limited ultrasound: Left shoulder:  Normal-appearing biceps tendon. Normal-appearing anterior and middle leaflet of the supraspinatus.  The posterior leaflet appears to be torn.  Upon interval viewing there appears to be a partial thickness bursal sided tear of the posterior leaflet.  Upon dynamic testing there appeared to be movement of the defect. Normal-appearing posterior glenohumeral joint.  Summary: Findings are suggestive of a mildly retracted posterior leaflet bursal aspect partial-thickness supraspinatus tear.  Ultrasound and interpretation by Clare Gandy, MD     Aspiration/Injection Procedure Note Lashone Loduca Depascale [birth year]  Procedure: Injection Indications: Left shoulder pain  Procedure Details Consent: Risks of procedure as well as the alternatives and risks of each were explained to the (patient/caregiver).  Consent for procedure obtained. Time Out: Verified patient identification, verified procedure, site/side was marked, verified correct patient position, special equipment/implants available, medications/allergies/relevent history reviewed, required imaging and test results available.  Performed.  The area was cleaned with iodine and alcohol swabs.    The left subacromial space was injected using 1 cc's of 40 mg Depo-Medrol and 4 cc's of 0.5% bupivacaine with a 25 1 1/2" needle.  Ultrasound was used. Images were obtained in long views showing the injection.     A sterile dressing was applied.  Patient did tolerate procedure well.     ASSESSMENT & PLAN:   Chronic left shoulder pain Ultrasound exam suggest that he has a supraspinatus tear.  Has some stiffness but this may be secondary to trying not to move the shoulder to induce pain.  May just be a large subacromial bursitis. -Subacromial injection today. -Counseled on home exercise therapy and supportive care. -Can continue the medications he is Artie been  provided. -If no improvement can consider glenohumeral injection.  May need to consider MRI to evaluate the retraction of the tear.

## 2018-11-04 NOTE — Assessment & Plan Note (Signed)
Ultrasound exam suggest that he has a supraspinatus tear.  Has some stiffness but this may be secondary to trying not to move the shoulder to induce pain.  May just be a large subacromial bursitis. -Subacromial injection today. -Counseled on home exercise therapy and supportive care. -Can continue the medications he is Artie been provided. -If no improvement can consider glenohumeral injection.  May need to consider MRI to evaluate the retraction of the tear.

## 2018-11-04 NOTE — Patient Instructions (Signed)
Nice to meet you Please try the exercises  Please try heat prior to exercise and then ice afterwards   Please send me a message in MyChart with any questions or updates.  Please see me back in 4-6 weeks.   --Dr. Jordan Likes

## 2018-11-05 DIAGNOSIS — H5213 Myopia, bilateral: Secondary | ICD-10-CM | POA: Diagnosis not present

## 2018-11-19 MED FILL — HUMALOG MIX 75-25 KWIKPEN: (75-25) 100 | 27 days supply | Qty: 15 | Fill #0

## 2018-11-23 ENCOUNTER — Ambulatory Visit: Payer: 59 | Admitting: Medical

## 2018-11-26 DIAGNOSIS — E1165 Type 2 diabetes mellitus with hyperglycemia: Secondary | ICD-10-CM | POA: Diagnosis not present

## 2018-11-26 DIAGNOSIS — E291 Testicular hypofunction: Secondary | ICD-10-CM | POA: Diagnosis not present

## 2018-11-26 DIAGNOSIS — E559 Vitamin D deficiency, unspecified: Secondary | ICD-10-CM | POA: Diagnosis not present

## 2018-11-26 DIAGNOSIS — F17200 Nicotine dependence, unspecified, uncomplicated: Secondary | ICD-10-CM | POA: Diagnosis not present

## 2018-11-29 ENCOUNTER — Other Ambulatory Visit: Payer: Self-pay | Admitting: Medical

## 2018-11-30 NOTE — Addendum Note (Signed)
Addended by: Sherrie George F on: 11/30/2018 02:13 PM   Modules accepted: Orders

## 2018-12-01 ENCOUNTER — Other Ambulatory Visit: Payer: Self-pay

## 2018-12-01 ENCOUNTER — Ambulatory Visit: Payer: Self-pay

## 2018-12-01 ENCOUNTER — Ambulatory Visit (INDEPENDENT_AMBULATORY_CARE_PROVIDER_SITE_OTHER): Payer: 59 | Admitting: Family Medicine

## 2018-12-01 ENCOUNTER — Encounter: Payer: Self-pay | Admitting: Family Medicine

## 2018-12-01 VITALS — BP 125/90 | HR 79 | Ht 70.0 in | Wt 210.0 lb

## 2018-12-01 DIAGNOSIS — G8929 Other chronic pain: Secondary | ICD-10-CM

## 2018-12-01 DIAGNOSIS — M25512 Pain in left shoulder: Secondary | ICD-10-CM

## 2018-12-01 MED ORDER — TRAMADOL HCL 50 MG PO TABS: ORAL_TABLET | ORAL | 0 refills | Status: DC

## 2018-12-01 MED ORDER — TRIAMCINOLONE ACETONIDE 40 MG/ML IJ SUSP
40.0000 mg | Freq: Once | INTRAMUSCULAR | Status: AC
  Administered 2018-12-01: 40 mg via INTRA_ARTICULAR

## 2018-12-01 MED FILL — DICLOFENAC SODIUM 75 MG TAB: 75 | 15 days supply | Qty: 30 | Fill #0

## 2018-12-01 MED FILL — CYCLOBENZAPRINE HCL 10 MG T: 10 | 10 days supply | Qty: 10 | Fill #0

## 2018-12-01 NOTE — Telephone Encounter (Signed)
Pt requesting Tramadol last filled 03/12/17

## 2018-12-01 NOTE — Patient Instructions (Signed)
Good to see you Please hold off on the exercises until the MRI is resulted.  Please try ice  Please use the tramadol for severe pain  Please continue to use ibuprofen and tylenol.   Please send me a message in MyChart with any questions or updates.  I will call you with the results of the MRI and decide follow up with that.   --Dr. Raeford Razor

## 2018-12-01 NOTE — Progress Notes (Signed)
[name] - 53 y.o. male MRN [id number]  Date of birth: 1966/05/18  SUBJECTIVE:  Including CC & ROS.  Chief Complaint  Patient presents with  . Follow-up    follow up for left shoulder    [name] is a 53 y.o. male that is presenting with worsening of the left shoulder pain.  He has received a subacromial injection with about 20% improvement of his pain.  He is having pain with abduction and reaching behind his back.  The pain is throbbing and worse at night.  He has some radiation down to his elbow.  The pain is becoming more constant and is moderate to severe.  Has had some limitations in his range of motion.    Review of Systems  Constitutional: Negative for fever.  HENT: Negative for congestion.   Respiratory: Negative for cough.   Cardiovascular: Negative for chest pain.  Gastrointestinal: Negative for abdominal pain.  Musculoskeletal: Negative for back pain.  Neurological: Negative for weakness.  Hematological: Negative for adenopathy.    HISTORY: Past Medical, Surgical, Social, and Family History Reviewed & Updated per EMR.   Pertinent Historical Findings include:  Past Medical History:  Diagnosis Date  . Diabetes mellitus without complication [facility])     Past Surgical History:  Procedure Laterality Date  . APPENDECTOMY      No Known Allergies  Family History  Problem Relation Age of Onset  . Diabetes Maternal Grandmother   . Arthritis Maternal Grandmother   . Arthritis Maternal Grandfather   . Arthritis Paternal Grandmother   . Arthritis Paternal Grandfather      Social History   Socioeconomic History  . Marital status: Married    Spouse name: Not on file  . Number of children: Not on file  . Years of education: Not on file  . Highest education level: Not on file  Occupational History  . Not on file  Social Needs  . Financial resource strain: Not on file  . Food insecurity    Worry: Not on file    Inability: Not on file  .  Transportation needs    Medical: Not on file    Non-medical: Not on file  Tobacco Use  . Smoking status: Current Every Day Smoker    Packs/day: 1.00    Years: 30.00    Pack years: 30.00    Types: Cigarettes    Start date: 10/29/1984  . Smokeless tobacco: Never Used  Substance and Sexual Activity  . Alcohol use: No    Alcohol/week: 0.0 standard drinks  . Drug use: No  . Sexual activity: Yes  Lifestyle  . Physical activity    Days per week: Not on file    Minutes per session: Not on file  . Stress: Not on file  Relationships  . Social Musician on phone: Not on file    Gets together: Not on file    Attends religious service: Not on file    Active member of club or organization: Not on file    Attends meetings of clubs or organizations: Not on file    Relationship status: Not on file  . Intimate partner violence    Fear of current or ex partner: Not on file    Emotionally abused: Not on file    Physically abused: Not on file    Forced sexual activity: Not on file  Other Topics Concern  . Not on file  Social History Narrative  . Not  on file     PHYSICAL EXAM:  VS: BP 125/90   Pulse 79   Ht 5\' 10"  (1.778 m)   Wt 210 lb (95.3 kg)   BMI 30.13 kg/m  Physical Exam Gen: NAD, alert, cooperative with exam, well-appearing ENT: normal lips, normal nasal mucosa,  Eye: normal EOM, normal conjunctiva and lids CV:  no edema, +2 pedal pulses   Resp: no accessory muscle use, non-labored,  GI: no masses or tenderness, no hernia  Skin: no rashes, no areas of induration  Neuro: normal tone, normal sensation to touch Psych:  normal insight, alert and oriented MSK:  Left shoulder: Limited active flexion and abduction. Limited external rotation compared to contralateral side. Normal strength resistance with internal and external rotation. Mild pain with empty can testing. No pain with speeds testing. Neurovascular intact   Aspiration/Injection Procedure Note [name] [birth year]  Procedure: Injection Indications: Left shoulder pain  Procedure Details Consent: Risks of procedure as well as the alternatives and risks of each were explained to the (patient/caregiver).  Consent for procedure obtained. Time Out: Verified patient identification, verified procedure, site/side was marked, verified correct patient position, special equipment/implants available, medications/allergies/relevent history reviewed, required imaging and test results available.  Performed.  The area was cleaned with iodine and alcohol swabs.    The left glenohumeral joint was injected using 1 cc's of 40 mg Kenalog, 3 cc's of 1% lidocaine and 3 cc's of 0.25% bupivacaine with a 22 3 1/2" needle.  Ultrasound was used. Images were obtained in trans views showing the injection.     A sterile dressing was applied.  Patient did tolerate procedure well.       ASSESSMENT & PLAN:   Adhesive capsulitis of left shoulder Symptoms today seem more associated with adhesive capsulitis.  He remembers having an injury initially and does have diabetes.  There was concern about a possible rotator cuff tear upon his first visit. -Glenohumeral injection -Counseled on supportive care -MRI of the shoulder has been ordered to evaluate labrum as well as rotator cuff.

## 2018-12-02 ENCOUNTER — Telehealth: Payer: Self-pay | Admitting: Medical

## 2018-12-02 MED ORDER — TRAMADOL HCL 50 MG PO TABS: ORAL_TABLET | ORAL | 0 refills | Status: DC

## 2018-12-02 MED FILL — traMADol HCL 50 MG TABS: 50 | 3 days supply | Qty: 12 | Fill #0

## 2018-12-02 NOTE — Telephone Encounter (Signed)
Opened to review and refill rx.

## 2018-12-02 NOTE — Assessment & Plan Note (Signed)
Symptoms today seem more associated with adhesive capsulitis.  He remembers having an injury initially and does have diabetes.  There was concern about a possible rotator cuff tear upon his first visit. -Glenohumeral injection -Counseled on supportive care -MRI of the shoulder has been ordered to evaluate labrum as well as rotator cuff.

## 2018-12-02 NOTE — Telephone Encounter (Signed)
Rx refill tramadol. Can fill when current rx expires.

## 2018-12-16 MED FILL — HUMALOG MIX 75-25 KWIKPEN: (75-25) 100 | 27 days supply | Qty: 15 | Fill #1

## 2018-12-24 ENCOUNTER — Ambulatory Visit
Admission: RE | Admit: 2018-12-24 | Discharge: 2018-12-24 | Disposition: A | Discharge status: Home / Self Care | Payer: 59 | Source: Ambulatory Visit | Attending: Family Medicine | Admitting: Family Medicine

## 2018-12-24 DIAGNOSIS — M75102 Unspecified rotator cuff tear or rupture of left shoulder, not specified as traumatic: Secondary | ICD-10-CM | POA: Diagnosis not present

## 2018-12-24 DIAGNOSIS — M25512 Pain in left shoulder: Secondary | ICD-10-CM

## 2018-12-24 DIAGNOSIS — M19012 Primary osteoarthritis, left shoulder: Secondary | ICD-10-CM | POA: Diagnosis not present

## 2018-12-27 ENCOUNTER — Telehealth: Payer: Self-pay | Admitting: Family Medicine

## 2018-12-27 NOTE — Telephone Encounter (Signed)
Spoke with patient about results.   Rosemarie Ax, MD [facility] 12/27/2018, 10:00 AM

## 2019-01-07 ENCOUNTER — Encounter: Payer: Self-pay | Admitting: Medical

## 2019-01-07 ENCOUNTER — Ambulatory Visit (INDEPENDENT_AMBULATORY_CARE_PROVIDER_SITE_OTHER): Payer: 59 | Admitting: Medical

## 2019-01-07 ENCOUNTER — Other Ambulatory Visit: Payer: Self-pay

## 2019-01-07 ENCOUNTER — Ambulatory Visit (HOSPITAL_BASED_OUTPATIENT_CLINIC_OR_DEPARTMENT_OTHER)
Admission: RE | Admit: 2019-01-07 | Discharge: 2019-01-07 | Disposition: A | Discharge status: Home / Self Care | Payer: 59 | Source: Ambulatory Visit | Attending: Medical | Admitting: Medical

## 2019-01-07 VITALS — BP 135/82 | HR 81 | Temp 98.3°F | Resp 16 | Ht 70.0 in | Wt 213.0 lb

## 2019-01-07 DIAGNOSIS — R0781 Pleurodynia: Secondary | ICD-10-CM | POA: Insufficient documentation

## 2019-01-07 DIAGNOSIS — Z0000 Encounter for general adult medical examination without abnormal findings: Secondary | ICD-10-CM

## 2019-01-07 DIAGNOSIS — R0789 Other chest pain: Secondary | ICD-10-CM

## 2019-01-07 DIAGNOSIS — Z125 Encounter for screening for malignant neoplasm of prostate: Secondary | ICD-10-CM | POA: Diagnosis not present

## 2019-01-07 DIAGNOSIS — Z0001 Encounter for general adult medical examination with abnormal findings: Secondary | ICD-10-CM

## 2019-01-07 DIAGNOSIS — R079 Chest pain, unspecified: Secondary | ICD-10-CM | POA: Diagnosis not present

## 2019-01-07 DIAGNOSIS — Z23 Encounter for immunization: Secondary | ICD-10-CM

## 2019-01-07 MED ORDER — ATORVASTATIN CALCIUM 10 MG PO TABS: 10.0000 mg | ORAL_TABLET | Freq: Every day | ORAL | 3 refills | Status: AC

## 2019-01-07 MED FILL — ATORVASTATIN 10 MG TABLET: 10 | 90 days supply | Qty: 90 | Fill #0

## 2019-01-07 NOTE — Progress Notes (Signed)
Subjective:    Patient ID: [name], male    DOB: 1965-11-13, 53 y.o.   MRN: [id number]  HPI  Pt in for wellness exam. CPE.  Pt is smoker. He is smoking 1/2 pack a day. Pt trying to avoid high cholesterol foods. No exercise. No alcohol. No drugs.   Pt states he gets some very faint, transient medial pectoralis area. He states daily and last for few seconds and then goes away. Last time he had this was yesterday. Was 5-10 seconds then subsided. Does not feel like palpitation. He never gets sob with this.  No hx of shoulder pain associated with the chest pain. No tinglng to arm associated with transient pian. No jaw pain.  Pt a1c has been 12. He sees endocrinolgist. Pt states endo did lipid panel recenlty and told him ok. He has had uncles with heart attack.  Also some rt lower rib are pain and back pain at times when takes deep breath and leaning over.   Review of Systems  Constitutional: Negative for chills, fatigue and fever.  Respiratory: Negative for apnea, cough, chest tightness, shortness of breath and wheezing.        Pleuritic pain rt side chest.  Cardiovascular: Negative for chest pain and palpitations.       Atypical chest pain as explained on hpi.  Gastrointestinal: Negative for abdominal pain.  Genitourinary: Negative for dysuria, frequency, hematuria, testicular pain and urgency.  Musculoskeletal: Negative for back pain.  Neurological: Negative for dizziness, weakness, numbness and headaches.  Hematological: Negative for adenopathy. Does not bruise/bleed easily.  Psychiatric/Behavioral: Negative for behavioral problems and confusion.    Past Medical History:  Diagnosis Date  . Diabetes mellitus without complication [facility])      Social History   Socioeconomic History  . Marital status: Married    Spouse name: Not on file  . Number of children: Not on file  . Years of education: Not on file  . Highest education level: Not on file  Occupational  History  . Not on file  Social Needs  . Financial resource strain: Not on file  . Food insecurity    Worry: Not on file    Inability: Not on file  . Transportation needs    Medical: Not on file    Non-medical: Not on file  Tobacco Use  . Smoking status: Current Every Day Smoker    Packs/day: 1.00    Years: 30.00    Pack years: 30.00    Types: Cigarettes    Start date: 10/29/1984  . Smokeless tobacco: Never Used  Substance and Sexual Activity  . Alcohol use: No    Alcohol/week: 0.0 standard drinks  . Drug use: No  . Sexual activity: Yes  Lifestyle  . Physical activity    Days per week: Not on file    Minutes per session: Not on file  . Stress: Not on file  Relationships  . Social Musician on phone: Not on file    Gets together: Not on file    Attends religious service: Not on file    Active member of club or organization: Not on file    Attends meetings of clubs or organizations: Not on file    Relationship status: Not on file  . Intimate partner violence    Fear of current or ex partner: Not on file    Emotionally abused: Not on file    Physically abused: Not on file  Forced sexual activity: Not on file  Other Topics Concern  . Not on file  Social History Narrative  . Not on file    Past Surgical History:  Procedure Laterality Date  . APPENDECTOMY      Family History  Problem Relation Age of Onset  . Diabetes Maternal Grandmother   . Arthritis Maternal Grandmother   . Arthritis Maternal Grandfather   . Arthritis Paternal Grandmother   . Arthritis Paternal Grandfather     No Known Allergies  Current Outpatient Medications on File Prior to Visit  Medication Sig Dispense Refill  . albuterol (PROVENTIL HFA;VENTOLIN HFA) 108 (90 Base) MCG/ACT inhaler Inhale 2 puffs into the lungs every 6 (six) hours as needed for wheezing or shortness of breath. 1 Inhaler 2  . aspirin EC 81 MG tablet Take 81 mg by mouth daily. Reported on 07/04/2015    .  cyclobenzaprine (FLEXERIL) 10 MG tablet TAKE 1 TABLET (10 MG TOTAL) BY MOUTH AT BEDTIME. 10 tablet 0  . diclofenac (VOLTAREN) 75 MG EC tablet Take 1 tablet (75 mg total) by mouth 2 (two) times daily. 30 tablet 0  . diclofenac (VOLTAREN) 75 MG EC tablet Take 1 tablet (75 mg total) by mouth 2 (two) times daily. 30 tablet 0  . diclofenac (VOLTAREN) 75 MG EC tablet TAKE 1 TABLET (75 MG TOTAL) BY MOUTH 2 (TWO) TIMES DAILY. 30 tablet 0  . glucose blood (TRUE METRIX BLOOD GLUCOSE TEST) test strip Use to check blood sugars twice daily    . HUMALOG MIX 75/25 KWIKPEN (75-25) 100 UNIT/ML Kwikpen     . Insulin Glargine (TOUJEO SOLOSTAR) 300 UNIT/ML SOPN Inject 50 Units into the skin daily.    . Insulin Pen Needle (B-D UF III MINI PEN NEEDLES) 31G X 5 MM MISC USE TO INJECT INSULIN DAILY AS DIRECTED.    Marland Kitchen. ipratropium (ATROVENT) 0.06 % nasal spray Place 2 sprays into both nostrils 4 (four) times daily. 15 mL 0  . metFORMIN (GLUCOPHAGE) 500 MG tablet Take 500 mg by mouth 2 (two) times daily with a meal.    . nicotine (RA NICOTINE) 14 mg/24hr patch Place 1 patch (14 mg total) onto the skin daily. 28 patch 1  . sildenafil (VIAGRA) 100 MG tablet Take 100 mg by mouth once as needed.    . sildenafil (VIAGRA) 100 MG tablet Take 1 tablet (100 mg total) by mouth daily as needed for erectile dysfunction. Max one tab in any 24 hour period. 10 tablet 0  . Testosterone (ANDROGEL) 20.25 MG/1.25GM (1.62%) GEL Apply the contents of two packets to the upper body once daily    . traMADol (ULTRAM) 50 MG tablet 1 tabs by mouth Q8 hours, maximum 6 tabs per day. 8 tablet 0  . traMADol (ULTRAM) 50 MG tablet 1 tab po q 6 hours prn pain 12 tablet 0  . trimethoprim-polymyxin b (POLYTRIM) ophthalmic solution Place 1 drop into both eyes every 6 (six) hours. 10 mL 0  . varenicline (CHANTIX PAK) 0.5 MG X 11 & 1 MG X 42 tablet Take one 0.5 mg tablet by mouth once daily for 3 days, then increase to one 0.5 mg tablet twice daily for 4 days, then  increase to one 1 mg tablet twice daily. 53 tablet 0   No current facility-administered medications on file prior to visit.     BP 135/82   Pulse 81   Temp 98.3 F (36.8 C) (Oral)   Resp 16   Ht 5\' 10"  (1.778  m)   Wt 213 lb (96.6 kg)   SpO2 98%   BMI 30.56 kg/m       Objective:   Physical Exam  General Mental Status- Alert. General Appearance- Not in acute distress.   Skin General: Color- Normal Color. Moisture- Normal Moisture.  Neck Carotid Arteries- Normal color. Moisture- Normal Moisture. No carotid bruits. No JVD.  Anterior chest- no costochondral junction tenderness.  Chest and Lung Exam Auscultation: Breath Sounds:-Normal.  Cardiovascular Auscultation:Rythm- Regular. Murmurs & Other Heart Sounds:Auscultation of the heart reveals- No Murmurs.  Abdomen Inspection:-Inspeection Normal. Palpation/Percussion:Note:No mass. Palpation and Percussion of the abdomen reveal- Non Tender, Non Distended + BS, no rebound or guarding.  Neurologic Cranial Nerve exam:- CN III-XII intact(No nystagmus), symmetric smile. Drift Test:- No drift. Romberg Exam:- Negative.  Heal to Toe Gait exam:-Normal. Finger to Nose:- Normal/Intact Strength:- 5/5 equal and symmetric strength both upper and lower extremities.     Assessment & Plan:  For you wellness exam today I have ordered cbc, cmp, and lipid panel.  Vaccine up to date. Psv 23 today and tdap.  Ekg done today for atypical chest pain. Very minimal/lower level and transient. None currently. Will go ahead and refer you to cardiologist due to risk factors and family hx.  During interim if you get recurent chest pain other than brief transient then ED evaluation.  For mild occasional pleuritic pain will get chest xray.  Recommend exercise and healthy diet.  We will let you know lab results as they come in.  [id number] charge also since addressed atypical chest pain and pleuritic pain  Follow up date appointment will be  determined after lab review.   Mackie Pai, PA-C

## 2019-01-07 NOTE — Patient Instructions (Addendum)
For you wellness exam today I have ordered cbc, cmp, and lipid panel.  Vaccine up to date. Psv 23 today and tdap.  Ekg done today for atypical chest pain. Very minimal/lower level and transient. None currently. Will go ahead and refer you to cardiologist due to risk factors and family hx. Sinus rhythm. Compared to prior ekg and very similar/no changes noted.  During interim if you get recurent chest pain other than brief transient then ED evaluation.  For mild occasional pleuritic pain will get chest xray.  Recommend exercise and healthy diet.  We will let you know lab results as they come in.  Follow up date appointment will be determined after lab review.    Preventive Care 18-21 Years Old, Male Preventive care refers to lifestyle choices and visits with your health care provider that can promote health and wellness. This includes:  A yearly physical exam. This is also called an annual well check.  Regular dental and eye exams.  Immunizations.  Screening for certain conditions.  Healthy lifestyle choices, such as eating a healthy diet, getting regular exercise, not using drugs or products that contain nicotine and tobacco, and limiting alcohol use. What can I expect for my preventive care visit? Physical exam Your health care provider will check:  Height and weight. These may be used to calculate body mass index (BMI), which is a measurement that tells if you are at a healthy weight.  Heart rate and blood pressure.  Your skin for abnormal spots. Counseling Your health care provider may ask you questions about:  Alcohol, tobacco, and drug use.  Emotional well-being.  Home and relationship well-being.  Sexual activity.  Eating habits.  Work and work Statistician. What immunizations do I need?  Influenza (flu) vaccine  This is recommended every year. Tetanus, diphtheria, and pertussis (Tdap) vaccine  You may need a Td booster every 10 years. Varicella  (chickenpox) vaccine  You may need this vaccine if you have not already been vaccinated. Zoster (shingles) vaccine  You may need this after age 71. Measles, mumps, and rubella (MMR) vaccine  You may need at least one dose of MMR if you were born in 1957 or later. You may also need a second dose. Pneumococcal conjugate (PCV13) vaccine  You may need this if you have certain conditions and were not previously vaccinated. Pneumococcal polysaccharide (PPSV23) vaccine  You may need one or two doses if you smoke cigarettes or if you have certain conditions. Meningococcal conjugate (MenACWY) vaccine  You may need this if you have certain conditions. Hepatitis A vaccine  You may need this if you have certain conditions or if you travel or work in places where you may be exposed to hepatitis A. Hepatitis B vaccine  You may need this if you have certain conditions or if you travel or work in places where you may be exposed to hepatitis B. Haemophilus influenzae type b (Hib) vaccine  You may need this if you have certain risk factors. Human papillomavirus (HPV) vaccine  If recommended by your health care provider, you may need three doses over 6 months. You may receive vaccines as individual doses or as more than one vaccine together in one shot (combination vaccines). Talk with your health care provider about the risks and benefits of combination vaccines. What tests do I need? Blood tests  Lipid and cholesterol levels. These may be checked every 5 years, or more frequently if you are over 52 years old.  Hepatitis C test.  Hepatitis B test. Screening  Lung cancer screening. You may have this screening every year starting at age 64 if you have a 30-pack-year history of smoking and currently smoke or have quit within the past 15 years.  Prostate cancer screening. Recommendations will vary depending on your family history and other risks.  Colorectal cancer screening. All adults should  have this screening starting at age 40 and continuing until age 57. Your health care provider may recommend screening at age 54 if you are at increased risk. You will have tests every 1-10 years, depending on your results and the type of screening test.  Diabetes screening. This is done by checking your blood sugar (glucose) after you have not eaten for a while (fasting). You may have this done every 1-3 years.  Sexually transmitted disease (STD) testing. Follow these instructions at home: Eating and drinking  Eat a diet that includes fresh fruits and vegetables, whole grains, lean protein, and low-fat dairy products.  Take vitamin and mineral supplements as recommended by your health care provider.  Do not drink alcohol if your health care provider tells you not to drink.  If you drink alcohol: ? Limit how much you have to 0-2 drinks a day. ? Be aware of how much alcohol is in your drink. In the [place] one drink equals one 12 oz bottle of beer (355 mL), one 5 oz glass of wine (148 mL), or one 1 oz glass of hard liquor (44 mL). Lifestyle  Take daily care of your teeth and gums.  Stay active. Exercise for at least 30 minutes on 5 or more days each week.  Do not use any products that contain nicotine or tobacco, such as cigarettes, e-cigarettes, and chewing tobacco. If you need help quitting, ask your health care provider.  If you are sexually active, practice safe sex. Use a condom or other form of protection to prevent STIs (sexually transmitted infections).  Talk with your health care provider about taking a low-dose aspirin every day starting at age 71. What's next?  Go to your health care provider once a year for a well check visit.  Ask your health care provider how often you should have your eyes and teeth checked.  Stay up to date on all vaccines. This information is not intended to replace advice given to you by your health care provider. Make sure you discuss any questions  you have with your health care provider. Document Released: 06/22/2015 Document Revised: 05/20/2018 Document Reviewed: 05/20/2018 Elsevier Patient Education  2020 [employer].

## 2019-01-12 ENCOUNTER — Ambulatory Visit (INDEPENDENT_AMBULATORY_CARE_PROVIDER_SITE_OTHER): Payer: 59 | Admitting: Cardiology

## 2019-01-12 ENCOUNTER — Encounter: Payer: Self-pay | Admitting: Cardiology

## 2019-01-12 ENCOUNTER — Other Ambulatory Visit: Payer: Self-pay

## 2019-01-12 VITALS — BP 116/62 | HR 83 | Ht 70.0 in | Wt 212.1 lb

## 2019-01-12 DIAGNOSIS — R0789 Other chest pain: Secondary | ICD-10-CM

## 2019-01-12 DIAGNOSIS — E782 Mixed hyperlipidemia: Secondary | ICD-10-CM

## 2019-01-12 DIAGNOSIS — E088 Diabetes mellitus due to underlying condition with unspecified complications: Secondary | ICD-10-CM

## 2019-01-12 DIAGNOSIS — R079 Chest pain, unspecified: Secondary | ICD-10-CM | POA: Diagnosis not present

## 2019-01-12 NOTE — Addendum Note (Signed)
Addended by: Polly Cobia A on: 01/12/2019 02:54 PM   Modules accepted: Orders

## 2019-01-12 NOTE — Progress Notes (Signed)
Cardiology Office Note:    Date:  01/12/2019   ID:  [name], DOB Oct 18, 1965, MRN [id number]  PCP:  Mackie Pai, PA-C  Cardiologist:  Jenean Lindau, MD   Referring MD: Mackie Pai, PA-C    ASSESSMENT:    1. Chest discomfort   2. Mixed dyslipidemia   3. Diabetes mellitus due to underlying condition with unspecified complications ([place])    PLAN:    In order of problems listed above:  1. Chest discomfort: Patient symptoms are atypical for coronary etiology.  Primary prevention stressed with the patient.  Importance of compliance with diet and medication stressed and he vocalized understanding.  In view of his symptoms we will do a Lexiscan sestamibi to assess for any objective evidence of coronary artery disease.  I also mentioned to the patient about calcium scoring and he is fine with getting it done for his stratification. 2. Diabetes mellitus: Diet was discussed and this is followed by his primary care physician. 3. Mixed dyslipidemia: Diet was discussed.  Patient is on statin therapy. 4. Cigarette smoking: I spent 5 minutes with the patient discussing solely about smoking. Smoking cessation was counseled. I suggested to the patient also different medications and pharmacological interventions. Patient is keen to try stopping on its own at this time. He will get back to me if he needs any further assistance in this matter. 5. Patient will be seen in follow-up appointment in 3 months or earlier if the patient has any concerns    Medication Adjustments/Labs and Tests Ordered: Current medicines are reviewed at length with the patient today.  Concerns regarding medicines are outlined above.  No orders of the defined types were placed in this encounter.  No orders of the defined types were placed in this encounter.    History of Present Illness:    [name] is a 53 y.o. male who is being seen today for the evaluation of chest discomfort at the request  of [place], Percell Miller, [place].  Patient is a pleasant 53 year old male.  He has past medical history of diabetes mellitus mixed dyslipidemia and smoking.  Patient mentions to me that he occasionally has chest discomfort which has been worried and therefore he is here for evaluation.  No orthopnea or PND.  About 3 times a week he walks about a mile without any symptoms.  At the time of my evaluation, the patient is alert awake oriented and in no distress.  No radiation of the symptoms to the neck or to the arms and the symptoms occur sporadically and unrelated to exertion.  Past Medical History:  Diagnosis Date  . Diabetes mellitus without complication [facility])     Past Surgical History:  Procedure Laterality Date  . APPENDECTOMY      Current Medications: Current Meds  Medication Sig  . aspirin EC 81 MG tablet Take 81 mg by mouth daily. Reported on 07/04/2015  . atorvastatin (LIPITOR) 10 MG tablet Take 1 tablet (10 mg total) by mouth daily.  . cyclobenzaprine (FLEXERIL) 10 MG tablet TAKE 1 TABLET (10 MG TOTAL) BY MOUTH AT BEDTIME.  Marland Kitchen glucose blood (TRUE METRIX BLOOD GLUCOSE TEST) test strip Use to check blood sugars twice daily  . HUMALOG MIX 75/25 KWIKPEN (75-25) 100 UNIT/ML Kwikpen   . Insulin Pen Needle (B-D UF III MINI PEN NEEDLES) 31G X 5 MM MISC USE TO INJECT INSULIN DAILY AS DIRECTED.  Marland Kitchen metFORMIN (GLUCOPHAGE) 500 MG tablet Take 500 mg by mouth 2 (two) times daily  with a meal.  . sildenafil (VIAGRA) 100 MG tablet Take 1 tablet (100 mg total) by mouth daily as needed for erectile dysfunction. Max one tab in any 24 hour period.  . Testosterone (ANDROGEL) 20.25 MG/1.25GM (1.62%) GEL Apply the contents of two packets to the upper body once daily  . traMADol (ULTRAM) 50 MG tablet 1 tab po q 6 hours prn pain  . varenicline (CHANTIX PAK) 0.5 MG X 11 & 1 MG X 42 tablet Take one 0.5 mg tablet by mouth once daily for 3 days, then increase to one 0.5 mg tablet twice daily for 4 days, then increase to one  1 mg tablet twice daily.     Allergies:   Patient has no known allergies.   Social History   Socioeconomic History  . Marital status: Married    Spouse name: Not on file  . Number of children: Not on file  . Years of education: Not on file  . Highest education level: Not on file  Occupational History  . Not on file  Social Needs  . Financial resource strain: Not on file  . Food insecurity    Worry: Not on file    Inability: Not on file  . Transportation needs    Medical: Not on file    Non-medical: Not on file  Tobacco Use  . Smoking status: Current Every Day Smoker    Packs/day: 1.00    Years: 30.00    Pack years: 30.00    Types: Cigarettes    Start date: 10/29/1984  . Smokeless tobacco: Never Used  Substance and Sexual Activity  . Alcohol use: No    Alcohol/week: 0.0 standard drinks  . Drug use: No  . Sexual activity: Yes  Lifestyle  . Physical activity    Days per week: Not on file    Minutes per session: Not on file  . Stress: Not on file  Relationships  . Social Musicianconnections    Talks on phone: Not on file    Gets together: Not on file    Attends religious service: Not on file    Active member of club or organization: Not on file    Attends meetings of clubs or organizations: Not on file    Relationship status: Not on file  Other Topics Concern  . Not on file  Social History Narrative  . Not on file     Family History: The patient's family history includes Arthritis in his maternal grandfather, maternal grandmother, paternal grandfather, and paternal grandmother; Diabetes in his maternal grandmother.  ROS:   Please see the history of present illness.    All other systems reviewed and are negative.  EKGs/Labs/Other Studies Reviewed:    The following studies were reviewed today: EKG reveals sinus rhythm with poor anterior forces   Recent Labs: No results found for requested labs within last 8760 hours.  Recent Lipid Panel    Component Value  Date/Time   CHOL 116 08/30/2008 2002   TRIG 74 08/30/2008 2002   HDL 33 (L) 08/30/2008 2002   CHOLHDL 3.5 Ratio 08/30/2008 2002   VLDL 15 08/30/2008 2002   LDLCALC 68 08/30/2008 2002    Physical Exam:    VS:  BP 116/62   Pulse 83   Ht 5\' 10"  (1.778 m)   Wt 212 lb 1.3 oz (96.2 kg)   SpO2 95%   BMI 30.43 kg/m     Wt Readings from Last 3 Encounters:  01/12/19 212 lb 1.3  oz (96.2 kg)  01/07/19 213 lb (96.6 kg)  12/01/18 210 lb (95.3 kg)     GEN: Patient is in no acute distress HEENT: Normal NECK: No JVD; No carotid bruits LYMPHATICS: No lymphadenopathy CARDIAC: S1 S2 regular, 2/6 systolic murmur at the apex. RESPIRATORY:  Clear to auscultation without rales, wheezing or rhonchi  ABDOMEN: Soft, non-tender, non-distended MUSCULOSKELETAL:  No edema; No deformity  SKIN: Warm and dry NEUROLOGIC:  Alert and oriented x 3 PSYCHIATRIC:  Normal affect    Signed, Garwin Brothers, MD  01/12/2019 2:13 PM    [facility]

## 2019-01-12 NOTE — Patient Instructions (Addendum)
Medication Instructions:   If you need a refill on your cardiac medications before your next appointment, please call your pharmacy.   Lab work:  If you have labs (blood work) drawn today and your tests are completely normal, you will receive your results only by: Marland Kitchen. MyChart Message (if you have MyChart) OR . A paper copy in the mail If you have any lab test that is abnormal or we need to change your treatment, we will call you to review the results.  Testing/Procedures: You had an EKG performed today.  You have been scheduled for a CT Calcium Score. There is a $150 fee due upon arrival. You will be contacted with the date and time. Location [address], [place]  Your physician has requested that you have a Scientist, physiologicallexiscan myoview. For further information please visit [email or web address]. Please follow instruction sheet, as given.    Follow-Up: At [facility], you and your health needs are our priority.  As part of our continuing mission to provide you with exceptional heart care, we have created designated Provider Care Teams.  These Care Teams include your primary Cardiologist (physician) and Advanced Practice Providers (APPs -  Physician Assistants and Nurse Practitioners) who all work together to provide you with the care you need, when you need it. You will need a follow up appointment in 4 months.   Any Other Special Instructions Will Be Listed Below  Regadenoson injection What is this medicine? REGADENOSON is used to test the heart for coronary artery disease. It is used in patients who can not exercise for their stress test. This medicine may be used for other purposes; ask your health care provider or pharmacist if you have questions. COMMON BRAND NAME(S): Lexiscan What should I tell my health care provider before I take this medicine? They need to know if you have any of these conditions:  heart problems  lung or breathing disease, like asthma or COPD   an unusual or allergic reaction to regadenoson, other medicines, foods, dyes, or preservatives  pregnant or trying to get pregnant  breast-feeding How should I use this medicine? This medicine is for injection into a vein. It is given by a health care professional in a hospital or clinic setting. Talk to your pediatrician regarding the use of this medicine in children. Special care may be needed. Overdosage: If you think you have taken too much of this medicine contact a poison control center or emergency room at once. NOTE: This medicine is only for you. Do not share this medicine with others. What if I miss a dose? This does not apply. What may interact with this medicine?  caffeine  dipyridamole  guarana  theophylline This list may not describe all possible interactions. Give your health care provider a list of all the medicines, herbs, non-prescription drugs, or dietary supplements you use. Also tell them if you smoke, drink alcohol, or use illegal drugs. Some items may interact with your medicine. What should I watch for while using this medicine? Your condition will be monitored carefully while you are receiving this medicine. Do not take medicines, foods, or drinks with caffeine (like coffee, tea, or colas) for at least 12 hours before your test. If you do not know if something contains caffeine, ask your health care professional. What side effects may I notice from receiving this medicine? Side effects that you should report to your doctor or health care professional as soon as possible:  allergic reactions like skin  rash, itching or hives, swelling of the face, lips, or tongue  breathing problems  chest pain, tightness or palpitations  severe headache Side effects that usually do not require medical attention (report to your doctor or health care professional if they continue or are bothersome):  flushing  headache  irritation or pain at site where injected   nausea, vomiting This list may not describe all possible side effects. Call your doctor for medical advice about side effects. You may report side effects to FDA at 1-800-FDA-1088. Where should I keep my medicine? This drug is given in a hospital or clinic and will not be stored at home. NOTE: This sheet is a summary. It may not cover all possible information. If you have questions about this medicine, talk to your doctor, pharmacist, or health care provider.  2020 Elsevier/Gold Standard (2008-01-24 15:08:13)  Cardiac Nuclear Scan A cardiac nuclear scan is a test that is done to check the flow of blood to your heart. It is done when you are resting and when you are exercising. The test looks for problems such as:  Not enough blood reaching a portion of the heart.  The heart muscle not working as it should. You may need this test if:  You have heart disease.  You have had lab results that are not normal.  You have had heart surgery or a balloon procedure to open up blocked arteries (angioplasty).  You have chest pain.  You have shortness of breath. In this test, a special dye (tracer) is put into your bloodstream. The tracer will travel to your heart. A camera will then take pictures of your heart to see how the tracer moves through your heart. This test is usually done at a hospital and takes 2-4 hours. Tell a doctor about:  Any allergies you have.  All medicines you are taking, including vitamins, herbs, eye drops, creams, and over-the-counter medicines.  Any problems you or family members have had with anesthetic medicines.  Any blood disorders you have.  Any surgeries you have had.  Any medical conditions you have.  Whether you are pregnant or may be pregnant. What are the risks? Generally, this is a safe test. However, problems may occur, such as:  Serious chest pain and heart attack. This is only a risk if the stress portion of the test is done.  Rapid heartbeat.   A feeling of warmth in your chest. This feeling usually does not last long.  Allergic reaction to the tracer. What happens before the test?  Ask your doctor about changing or stopping your normal medicines. This is important.  Follow instructions from your doctor about what you cannot eat or drink.  Remove your jewelry on the day of the test. What happens during the test?  An IV tube will be inserted into one of your veins.  Your doctor will give you a small amount of tracer through the IV tube.  You will wait for 20-40 minutes while the tracer moves through your bloodstream.  Your heart will be monitored with an electrocardiogram (ECG).  You will lie down on an exam table.  Pictures of your heart will be taken for about 15-20 minutes.  You may also have a stress test. For this test, one of these things may be done: ? You will be asked to exercise on a treadmill or a stationary bike. ? You will be given medicines that will make your heart work harder. This is done if you are unable  to exercise.  When blood flow to your heart has peaked, a tracer will again be given through the IV tube.  After 20-40 minutes, you will get back on the exam table. More pictures will be taken of your heart.  Depending on the tracer that is used, more pictures may need to be taken 3-4 hours later.  Your IV tube will be removed when the test is over. The test may vary among doctors and hospitals. What happens after the test?  Ask your doctor: ? Whether you can return to your normal schedule, including diet, activities, and medicines. ? Whether you should drink more fluids. This will help to remove the tracer from your body. Drink enough fluid to keep your pee (urine) pale yellow.  Ask your doctor, or the department that is doing the test: ? When will my results be ready? ? How will I get my results? Summary  A cardiac nuclear scan is a test that is done to check the flow of blood to your  heart.  Tell your doctor whether you are pregnant or may be pregnant.  Before the test, ask your doctor about changing or stopping your normal medicines. This is important.  Ask your doctor whether you can return to your normal activities. You may be asked to drink more fluids. This information is not intended to replace advice given to you by your health care provider. Make sure you discuss any questions you have with your health care provider. Document Released: 11/09/2017 Document Revised: 09/15/2018 Document Reviewed: 11/09/2017 Elsevier Patient Education  2020 [employer].

## 2019-01-14 ENCOUNTER — Other Ambulatory Visit: Payer: 59

## 2019-01-14 DIAGNOSIS — E291 Testicular hypofunction: Secondary | ICD-10-CM | POA: Diagnosis not present

## 2019-01-14 DIAGNOSIS — E1165 Type 2 diabetes mellitus with hyperglycemia: Secondary | ICD-10-CM | POA: Diagnosis not present

## 2019-01-14 DIAGNOSIS — F17200 Nicotine dependence, unspecified, uncomplicated: Secondary | ICD-10-CM | POA: Diagnosis not present

## 2019-01-14 DIAGNOSIS — E559 Vitamin D deficiency, unspecified: Secondary | ICD-10-CM | POA: Diagnosis not present

## 2019-01-14 MED FILL — HUMALOG MIX 75-25 KWIKPEN: (75-25) 100 | 27 days supply | Qty: 15 | Fill #0

## 2019-01-17 MED FILL — PENTIPS 31G X 5 MM MISC: 31G X 5 MM | 90 days supply | Qty: 100 | Fill #1

## 2019-01-17 MED FILL — ACCU-CHEK GUIDE TEST STRIP: 30 days supply | Qty: 100 | Fill #1

## 2019-01-20 ENCOUNTER — Encounter (HOSPITAL_COMMUNITY): Payer: 59

## 2019-01-21 ENCOUNTER — Encounter (HOSPITAL_COMMUNITY): Payer: 59

## 2019-01-21 ENCOUNTER — Other Ambulatory Visit: Payer: 59

## 2019-01-31 ENCOUNTER — Telehealth (HOSPITAL_COMMUNITY): Payer: Self-pay

## 2019-01-31 NOTE — Telephone Encounter (Signed)
Instructions left on the patient's answering machine. Asked to call back with any questions or concerns. S.Tahjay Binion EMTP

## 2019-02-01 ENCOUNTER — Encounter: Payer: 59 | Attending: Internal Medicine | Admitting: Nutrition

## 2019-02-01 ENCOUNTER — Other Ambulatory Visit: Payer: Self-pay

## 2019-02-01 DIAGNOSIS — E1165 Type 2 diabetes mellitus with hyperglycemia: Secondary | ICD-10-CM | POA: Diagnosis not present

## 2019-02-02 NOTE — Progress Notes (Signed)
Patient reports that he is here to learn about how to eat.  Says he has never been told what to eat, nor  how much, as well as how to control his blood sugars.  Exercise: Has not been exercising due to time constraints fof last 2 weeks, but walks about 30 min. 2-4 times/wk.   SBGM:  Meter: unknown.  He did not bring his meter, but says he is testing his blood sugars only fasting, and they have been in the 200s, and once in the low 300s.   Insulin dose:  Humalog 75/25:  35u acB, 25u acS.  He does not alter this dose.   We discussed what is causing his blood sugars to rise and what he can do to improve his blood sugar control:  Diet, exercise and weight loss.  He is weight goal is 180 pounds.  Current Wt. Is 211.   We also discussed the importance of balanced meals and the need to limit quanties of each food group.   He was put on a 2200 calorie diet and was given a diet plan that matched what he was doing, but with less fat and meat portions sizes. Typical day:   5AM awake, checks blood sugar and drinks coffee. 6:30AM: bfast:  Chereos, or 2-3 eggs, with has browns, or sausage, 1-2 pieces of toast with avacado, coffee or milk-8 ounces 9:30AM: Break:  Sandwich-water or diet drink, or unsweet. Ice teaa 1PM: lunch:  Leftovers- protein, 3-4 ounces, rice-2 cups, diet drink or water, or sandwich with chips. 5:30 Supper:  Takeout of fast foods--larger sandwich with diet drink 8PM: beef jerky, or nuts, or P&J sandwich.  Suggestions given:   Meals are high in fat and meat portions are too much.  1. No cold cereal and milk 2.  Limit bfast to 2 eggs with no additional protein, and 2 pieces of toast with 1/2 avocado.  Limit oil to cook eggs.  Other suggestions given for breakfast choices like 2 pieces of toast with cheese or peanut butter, etc 3.  Limit midmorning snack to 1 starch and 1ounce protein. 4.  Limit HS snacking to 150 calories or less.   5.  Exercise 5 days/wk. For 30-40 min. At a moderate pace as  tolerated.   6.  Test blood sugar before and 2 hours after eating one meal each day-alternating meal eaten each day.   7.  Call in one week with blood sugar readings 8. Return in 2 weeks.  Pt. Did not want to do this, and preferred to return in one month.

## 2019-02-03 ENCOUNTER — Inpatient Hospital Stay: Admission: RE | Admit: 2019-02-03 | Payer: 59 | Source: Ambulatory Visit

## 2019-02-03 ENCOUNTER — Other Ambulatory Visit: Payer: Self-pay

## 2019-02-03 ENCOUNTER — Ambulatory Visit (INDEPENDENT_AMBULATORY_CARE_PROVIDER_SITE_OTHER)
Admission: RE | Admit: 2019-02-03 | Discharge: 2019-02-03 | Disposition: A | Discharge status: Home / Self Care | Payer: Self-pay | Source: Ambulatory Visit | Attending: Cardiology | Admitting: Cardiology

## 2019-02-03 ENCOUNTER — Ambulatory Visit (HOSPITAL_COMMUNITY): Payer: 59

## 2019-02-03 DIAGNOSIS — R079 Chest pain, unspecified: Secondary | ICD-10-CM

## 2019-02-04 NOTE — Patient Instructions (Signed)
1. No cold cereal and milk 2.  Limit bfast to 2 eggs with no additional protein, and 2 pieces of toast with 1/2 avocado.  Limit oil to cook eggs.  Other suggestions given for breakfast choices like 2 pieces of toast with cheese or peanut butter. 3.  Limit midmorning snack to 1 starch and 1ounce protein. 4.  Limit HS snacking to 150 calories or less.   5.  Exercise 5 days/wk. For 30-40 min. At a moderate pace as tolerated.   6.  Test blood sugar before and 2 hours after eating one meal each day-alternating meal eaten each day.   7.  Call in one week with blood sugar readings

## 2019-02-08 ENCOUNTER — Encounter: Payer: Self-pay | Admitting: *Deleted

## 2019-02-08 ENCOUNTER — Telehealth (HOSPITAL_COMMUNITY): Payer: Self-pay | Admitting: *Deleted

## 2019-02-08 NOTE — Telephone Encounter (Signed)
Patient given detailed instructions per Myocardial Perfusion Study Information Sheet for the test on 02/11/19 at 7:15. Patient notified to arrive 15 minutes early and that it is imperative to arrive on time for appointment to keep from having the test rescheduled.  If you need to cancel or reschedule your appointment, please call the office within 24 hours of your appointment. . Patient verbalized understanding.[name]

## 2019-02-11 ENCOUNTER — Ambulatory Visit (HOSPITAL_COMMUNITY): Payer: 59 | Attending: Cardiovascular Disease

## 2019-02-11 ENCOUNTER — Telehealth: Payer: Self-pay

## 2019-02-11 ENCOUNTER — Other Ambulatory Visit: Payer: Self-pay

## 2019-02-11 VITALS — Ht 70.0 in | Wt 212.0 lb

## 2019-02-11 DIAGNOSIS — R0789 Other chest pain: Secondary | ICD-10-CM | POA: Insufficient documentation

## 2019-02-11 DIAGNOSIS — R079 Chest pain, unspecified: Secondary | ICD-10-CM | POA: Insufficient documentation

## 2019-02-11 LAB — MYOCARDIAL PERFUSION IMAGING
LV dias vol: 97 mL (ref 62–150)
LV sys vol: 44 mL
Peak HR: 89 {beats}/min
Rest HR: 68 {beats}/min
SDS: 0
SRS: 0
SSS: 0
TID: 1.04

## 2019-02-11 MED ORDER — TECHNETIUM TC 99M TETROFOSMIN IV KIT
31.8000 | PACK | Freq: Once | INTRAVENOUS | Status: AC | PRN
  Administered 2019-02-11: 31.8 via INTRAVENOUS
  Filled 2019-02-11: qty 32

## 2019-02-11 MED ORDER — REGADENOSON 0.4 MG/5ML IV SOLN
0.4000 mg | Freq: Once | INTRAVENOUS | Status: AC
  Administered 2019-02-11: 0.4 mg via INTRAVENOUS

## 2019-02-11 MED ORDER — TECHNETIUM TC 99M TETROFOSMIN IV KIT
10.9000 | PACK | Freq: Once | INTRAVENOUS | Status: AC | PRN
  Administered 2019-02-11: 10.9 via INTRAVENOUS
  Filled 2019-02-11: qty 11

## 2019-02-11 NOTE — Telephone Encounter (Signed)
Results relayed, copy sent to Dr. Saguier. 

## 2019-02-11 NOTE — Telephone Encounter (Signed)
-----   Message from Jenean Lindau, MD sent at 02/11/2019 11:33 AM EDT ----- The results of the study is unremarkable. Please inform patient. I will discuss in detail at next appointment. Cc  primary care/referring physician Jenean Lindau, MD 02/11/2019 11:33 AM

## 2019-02-15 MED FILL — HUMALOG MIX 75-25 KWIKPEN: (75-25) 100 | 53 days supply | Qty: 30 | Fill #1

## 2019-03-02 ENCOUNTER — Ambulatory Visit: Payer: 59 | Admitting: Nutrition

## 2019-03-15 DIAGNOSIS — E291 Testicular hypofunction: Secondary | ICD-10-CM | POA: Diagnosis not present

## 2019-03-15 DIAGNOSIS — E559 Vitamin D deficiency, unspecified: Secondary | ICD-10-CM | POA: Diagnosis not present

## 2019-03-15 DIAGNOSIS — E1165 Type 2 diabetes mellitus with hyperglycemia: Secondary | ICD-10-CM | POA: Diagnosis not present

## 2019-03-15 DIAGNOSIS — F17200 Nicotine dependence, unspecified, uncomplicated: Secondary | ICD-10-CM | POA: Diagnosis not present

## 2019-03-23 ENCOUNTER — Other Ambulatory Visit: Payer: Self-pay

## 2019-03-23 ENCOUNTER — Encounter: Payer: 59 | Attending: Internal Medicine | Admitting: Nutrition

## 2019-03-23 DIAGNOSIS — E1165 Type 2 diabetes mellitus with hyperglycemia: Secondary | ICD-10-CM | POA: Insufficient documentation

## 2019-03-25 NOTE — Patient Instructions (Signed)
Read over information given on V-Go and go to website for more information

## 2019-03-25 NOTE — Progress Notes (Signed)
Patient did not bring meter with him.  Says has made some diet changes in balancing his meals, and stopping cold cereal and milk.  But still he continues to eat most of his calories from supper to bedtime.   He reports that FBSs are still over 200 most of the time, and acS are 160s-180.   Current insulin dose: 75/25:  35AM/ 25PM.  Exercise is only on weekends and not consistant.  Discussed the idea that he needs insulin appropriate in time and amount to what he is eating, and the times he is eating, and the 75/25 insulin does not do this.  He reported good understanding of this.   Per Dr Creig Hines written order, he was shown the V-Go and Omni Pod insulin devices.  We discussed how each one works, and what is needed for each device.  He would like to try the V-Go, but is very hesitant of cost.  An insurance verification form was filled in and faxed to [facility] customer care.  He is excited to start this, and requested another appointment next Tuesday to begin this.  He was given a brochure on the V-Go and encouraged to go to the website to learn more about this device.  He agreed to do this.

## 2019-03-29 ENCOUNTER — Encounter: Payer: 59 | Admitting: Nutrition

## 2019-04-05 ENCOUNTER — Encounter: Payer: 59 | Admitting: Nutrition

## 2019-04-08 MED FILL — PENTIPS 31G X 5 MM MISC: 31G X 5 MM | 90 days supply | Qty: 100 | Fill #0

## 2019-04-08 MED FILL — HUMALOG MIX 75-25 KWIKPEN: (75-25) 100 | 53 days supply | Qty: 30 | Fill #2

## 2019-04-08 MED FILL — ACCU-CHEK GUIDE TEST STRIP: 30 days supply | Qty: 100 | Fill #2

## 2019-04-08 MED FILL — ACCU-CHEK FASTCLIX LANCETS: 30 days supply | Qty: 102 | Fill #1

## 2019-04-13 ENCOUNTER — Encounter: Payer: 59 | Attending: Internal Medicine | Admitting: Nutrition

## 2019-04-13 ENCOUNTER — Other Ambulatory Visit: Payer: Self-pay

## 2019-04-13 DIAGNOSIS — E1165 Type 2 diabetes mellitus with hyperglycemia: Secondary | ICD-10-CM | POA: Insufficient documentation

## 2019-04-13 DIAGNOSIS — E088 Diabetes mellitus due to underlying condition with unspecified complications: Secondary | ICD-10-CM

## 2019-04-14 NOTE — Progress Notes (Signed)
Mr. Ellingson was trained on the V-Go 30.   He was shown how to fill, apply and use the V-go.  He re demonstrated correctly how to fill the V-go, and then filled one with Humalog insulin.  He applied it at 5PM to his mid right abdomen without difficulty.  He was reminded that he will not longer take his 70/30 insulin starting this evening.  And he re verbalized this to me.  We reviewed how to give the clicks, and that each click delivers 2u of Humalog insulin.  Written instructions were given to give 4 clicks for his 2 smaller meals and 5 clicks for his larger meal, which is usually supper.  We discussed again, how this device delivers insulin, and the need to remove and replace the device every 24 hours, even if insulin remains in the device. We also discussed that the device can deliver only 18 clicks, and he reported good understanding of this as well.   We also discussed the need to test blood sugars before meals and at bedtime.  He was given extra test strips for this, and a log sheet to use for the next 2 weeks.  He will start this tonight.  He was directed to call Dr. Creig Hines office on Friday with blood sugar readings, and he agreed to do this. He was given a V-Go 30 starter kit with directions for use, with 12 V-Go samples. He was directed to call the [place]  help line 24 hour numberl if questions.  I will call him tomorrow to see how he is doing.He had no final questions.

## 2019-04-14 NOTE — Patient Instructions (Addendum)
1.  Stop 70/30 insulin 2.  Fill and apply a new V-Go every 24 hours 3.  Give 4 clicks for the 2 smaller meals and 5 clicks for his larger meal 4.  Test blood sugars before meals and at bedtime and call the results to Dr. Creig Hines office on Friday

## 2019-04-19 DIAGNOSIS — F17200 Nicotine dependence, unspecified, uncomplicated: Secondary | ICD-10-CM | POA: Diagnosis not present

## 2019-04-19 DIAGNOSIS — E291 Testicular hypofunction: Secondary | ICD-10-CM | POA: Diagnosis not present

## 2019-04-19 DIAGNOSIS — E559 Vitamin D deficiency, unspecified: Secondary | ICD-10-CM | POA: Diagnosis not present

## 2019-04-19 DIAGNOSIS — E1165 Type 2 diabetes mellitus with hyperglycemia: Secondary | ICD-10-CM | POA: Diagnosis not present

## 2019-04-26 MED FILL — HumaLOG 100 UNIT/ML SOLN: 100 | 90 days supply | Qty: 90 | Fill #0

## 2019-04-28 ENCOUNTER — Ambulatory Visit: Payer: 59 | Admitting: Cardiology

## 2019-05-20 MED FILL — OMNIPOD DASH 5 PACK MISC: 90 days supply | Qty: 30 | Fill #0

## 2019-07-13 ENCOUNTER — Telehealth: Payer: Self-pay | Admitting: Medical

## 2019-07-13 ENCOUNTER — Encounter: Payer: Self-pay | Admitting: Medical

## 2019-07-13 ENCOUNTER — Ambulatory Visit (INDEPENDENT_AMBULATORY_CARE_PROVIDER_SITE_OTHER): Payer: 59 | Admitting: Medical

## 2019-07-13 ENCOUNTER — Ambulatory Visit (HOSPITAL_BASED_OUTPATIENT_CLINIC_OR_DEPARTMENT_OTHER)
Admission: RE | Admit: 2019-07-13 | Discharge: 2019-07-13 | Disposition: A | Discharge status: Home / Self Care | Payer: 59 | Source: Ambulatory Visit | Attending: Medical | Admitting: Medical

## 2019-07-13 ENCOUNTER — Other Ambulatory Visit: Payer: Self-pay

## 2019-07-13 DIAGNOSIS — R062 Wheezing: Secondary | ICD-10-CM

## 2019-07-13 DIAGNOSIS — R05 Cough: Secondary | ICD-10-CM

## 2019-07-13 DIAGNOSIS — F17200 Nicotine dependence, unspecified, uncomplicated: Secondary | ICD-10-CM

## 2019-07-13 DIAGNOSIS — R0600 Dyspnea, unspecified: Secondary | ICD-10-CM | POA: Diagnosis not present

## 2019-07-13 DIAGNOSIS — R059 Cough, unspecified: Secondary | ICD-10-CM

## 2019-07-13 DIAGNOSIS — J40 Bronchitis, not specified as acute or chronic: Secondary | ICD-10-CM

## 2019-07-13 MED ORDER — BENZONATATE 100 MG PO CAPS: 100.0000 mg | ORAL_CAPSULE | Freq: Three times a day (TID) | ORAL | 0 refills | Status: DC | PRN

## 2019-07-13 MED ORDER — BUDESONIDE-FORMOTEROL FUMARATE 80-4.5 MCG/ACT IN AERO: 2.0000 | INHALATION_SPRAY | Freq: Two times a day (BID) | RESPIRATORY_TRACT | 3 refills | Status: DC

## 2019-07-13 MED ORDER — VARENICLINE TARTRATE 0.5 MG X 11 & 1 MG X 42 PO MISC: ORAL | 0 refills | Status: DC

## 2019-07-13 MED ORDER — ALBUTEROL SULFATE HFA 108 (90 BASE) MCG/ACT IN AERS: 2.0000 | INHALATION_SPRAY | Freq: Four times a day (QID) | RESPIRATORY_TRACT | 0 refills | Status: DC | PRN

## 2019-07-13 MED ORDER — AZITHROMYCIN 250 MG PO TABS: ORAL_TABLET | ORAL | 0 refills | Status: DC

## 2019-07-13 MED FILL — ALBUTEROL SULFATE HFA 108 (: 108 (90 BAS | 25 days supply | Qty: 18 | Fill #0

## 2019-07-13 MED FILL — AZITHROMYCIN 250 MG TABLET: 250 | 5 days supply | Qty: 6 | Fill #0

## 2019-07-13 MED FILL — CHANTIX STARTING MONTH BOX: 0.5 MG X 11 | 28 days supply | Qty: 53 | Fill #0

## 2019-07-13 MED FILL — SYMBICORT 80-4.5 MCG INH: 80-4.5 | 30 days supply | Qty: 10 | Fill #0

## 2019-07-13 MED FILL — BENZONATATE 100 MG CAPS: 100 | 10 days supply | Qty: 30 | Fill #0

## 2019-07-13 NOTE — Patient Instructions (Addendum)
You do have cough with sob over past 3 weeks. With hx of smoking bronchitis and copd possible.  Rx azithromycin, symbicort, albuterol inhaler, and  Benzonatate for cough.  Chest xray today.  O2 sat Recommend you buy one today. If not will check o2 sat when you come for chest xray covid test.  For smoking cessation rx Chantix.  Follow 7-10 days or as needed.  Go ahead and call to get tested for covid. That way if negative study better chance can see you as you also had second covid vaccine past thursday  Ct screening for lung cancer placed. Will see if prior authorization cleared.  If prior auth fails then will refer to pulmonologist.

## 2019-07-13 NOTE — Telephone Encounter (Signed)
I put in ct lung cancer screening. Will you see if prior authorization goes thru.

## 2019-07-13 NOTE — Progress Notes (Signed)
Subjective:    Patient ID: [name], male    DOB: 12/02/1965, 54 y.o.   MRN: [id number]  HPI  Virtual Visit via Video Note  I connected with Abdulla Pooley Barrientez on 07/13/19 at  1:20 PM EST by a video enabled telemedicine application and verified that I am speaking with the correct person using two identifiers.  Location: Patient: home Provider: office   I discussed the limitations of evaluation and management by telemedicine and the availability of in person appointments. The patient expressed understanding and agreed to proceed.  History of Present Illness:   Pt states he has been cough for couple of 3 weeks. Cough dry. Occasional mucus.. But he feels like has some chest congestion. He states his lungs feel like on fire if he exercise. Pt thought couple of nights ago some wheezing. He has cough at night.  Pt has history of smoking. I had written chantix in the past and it helped some but indicates was not able to stop completely. Pt is diabetic.   Pt has been smoking since 54 yo. Over 30 years and on avg pack a day.  Pt does see endocrinologist.   Pt did see Dr Cherrie Gauze. Work up was negative.   EXAM: Coronary Calcium Score  TECHNIQUE: The patient was scanned on a [employer]. Axial non-contrast 3 mm slices were carried out through the heart. The data set was analyzed on a dedicated work station and scored using the Drew.  FINDINGS: Non-cardiac: See separate report from [facility].  Ascending Aorta: Normal caliber.  Pericardium: Normal.  Coronary arteries: Normal origins.  IMPRESSION: Coronary calcium score of 0.  No calf swelling and no popiteal pain.  Pt had 2nd covid vaccine.  Pt denies chest pain. Stats lung pain when exercise.    Observations/Objective:  General-no acute distress, pleasant, oriented. Lungs- on inspection lungs appear unlabored. Neck- no tracheal deviation or jvd on inspection. Neuro-  gross motor function appears intact.   Assessment and Plan: You do have cough with sob over past 3 weeks. With hx of smoking bronchitis and copd possible.  Rx azithromycin, symbicort, albuterol inhaler, and  Benzonatate for cough.  Chest xray today.  O2 sat Recommend you buy one today. If not will check o2 sat when you come for chest xray covid test.  For smoking cessation rx Chantix.  Follow 7-10 days or    Ct screening for lung cancer placed. Will see if prior authorization cleared.  If prior auth fails then will refer to pulmonologist.  Follow Up Instructions:    I discussed the assessment and treatment plan with the patient. The patient was provided an opportunity to ask questions and all were answered. The patient agreed with the plan and demonstrated an understanding of the instructions.   The patient was advised to call back or seek an in-person evaluation if the symptoms worsen or if the condition fails to improve as anticipated.  I provided 30 + minutes of non-face-to-face time during this encounter.  Mackie Pai, PA-C   [employer], PA-C   Review of Systems  Constitutional: Negative for chills, fatigue and fever.  HENT: Negative for congestion.        Pt states some decreased smell and taste but he attributes this to smoking over the years. He can feel pnd.  Cardiovascular: Negative for chest pain and palpitations.  Gastrointestinal: Negative for abdominal pain, diarrhea and nausea.  Musculoskeletal: Negative for back pain.  Achiness to back worse than his normal.   Hematological: Negative for adenopathy.       Objective:   Physical Exam        Assessment & Plan:

## 2019-07-14 ENCOUNTER — Other Ambulatory Visit: Payer: 59

## 2019-07-14 ENCOUNTER — Ambulatory Visit: Payer: 59 | Attending: Internal Medicine

## 2019-07-14 DIAGNOSIS — Z20822 Contact with and (suspected) exposure to covid-19: Secondary | ICD-10-CM | POA: Diagnosis not present

## 2019-07-16 LAB — NOVEL CORONAVIRUS, NAA: SARS-CoV-2, NAA: NOT DETECTED

## 2019-07-18 ENCOUNTER — Other Ambulatory Visit: Payer: 59

## 2019-07-22 ENCOUNTER — Ambulatory Visit (HOSPITAL_BASED_OUTPATIENT_CLINIC_OR_DEPARTMENT_OTHER)
Admission: RE | Admit: 2019-07-22 | Discharge: 2019-07-22 | Disposition: A | Discharge status: Home / Self Care | Payer: 59 | Source: Ambulatory Visit | Attending: Medical | Admitting: Medical

## 2019-07-22 ENCOUNTER — Other Ambulatory Visit: Payer: Self-pay

## 2019-07-22 DIAGNOSIS — R0600 Dyspnea, unspecified: Secondary | ICD-10-CM | POA: Diagnosis not present

## 2019-07-22 DIAGNOSIS — F17200 Nicotine dependence, unspecified, uncomplicated: Secondary | ICD-10-CM | POA: Insufficient documentation

## 2019-07-22 DIAGNOSIS — R062 Wheezing: Secondary | ICD-10-CM | POA: Insufficient documentation

## 2019-07-22 DIAGNOSIS — R05 Cough: Secondary | ICD-10-CM | POA: Insufficient documentation

## 2019-07-22 DIAGNOSIS — R059 Cough, unspecified: Secondary | ICD-10-CM

## 2019-07-22 DIAGNOSIS — F17210 Nicotine dependence, cigarettes, uncomplicated: Secondary | ICD-10-CM | POA: Diagnosis not present

## 2019-08-08 ENCOUNTER — Observation Stay (HOSPITAL_BASED_OUTPATIENT_CLINIC_OR_DEPARTMENT_OTHER)
Admission: EM | Admit: 2019-08-08 | Discharge: 2019-08-09 | Disposition: A | Discharge status: Home / Self Care | Payer: 59 | Attending: Internal Medicine | Admitting: Internal Medicine

## 2019-08-08 ENCOUNTER — Encounter (HOSPITAL_BASED_OUTPATIENT_CLINIC_OR_DEPARTMENT_OTHER): Payer: Self-pay | Admitting: Emergency Medicine

## 2019-08-08 ENCOUNTER — Emergency Department (HOSPITAL_BASED_OUTPATIENT_CLINIC_OR_DEPARTMENT_OTHER): Payer: 59

## 2019-08-08 ENCOUNTER — Other Ambulatory Visit: Payer: Self-pay

## 2019-08-08 DIAGNOSIS — Z20822 Contact with and (suspected) exposure to covid-19: Secondary | ICD-10-CM | POA: Insufficient documentation

## 2019-08-08 DIAGNOSIS — I639 Cerebral infarction, unspecified: Secondary | ICD-10-CM | POA: Diagnosis not present

## 2019-08-08 DIAGNOSIS — E669 Obesity, unspecified: Secondary | ICD-10-CM | POA: Insufficient documentation

## 2019-08-08 DIAGNOSIS — E119 Type 2 diabetes mellitus without complications: Secondary | ICD-10-CM | POA: Diagnosis not present

## 2019-08-08 DIAGNOSIS — Z794 Long term (current) use of insulin: Secondary | ICD-10-CM | POA: Insufficient documentation

## 2019-08-08 DIAGNOSIS — Z7982 Long term (current) use of aspirin: Secondary | ICD-10-CM | POA: Insufficient documentation

## 2019-08-08 DIAGNOSIS — F17210 Nicotine dependence, cigarettes, uncomplicated: Secondary | ICD-10-CM | POA: Diagnosis not present

## 2019-08-08 DIAGNOSIS — Z7951 Long term (current) use of inhaled steroids: Secondary | ICD-10-CM | POA: Diagnosis not present

## 2019-08-08 DIAGNOSIS — R292 Abnormal reflex: Secondary | ICD-10-CM

## 2019-08-08 DIAGNOSIS — R200 Anesthesia of skin: Secondary | ICD-10-CM

## 2019-08-08 DIAGNOSIS — I10 Essential (primary) hypertension: Secondary | ICD-10-CM | POA: Diagnosis not present

## 2019-08-08 DIAGNOSIS — I700 Atherosclerosis of aorta: Secondary | ICD-10-CM | POA: Diagnosis not present

## 2019-08-08 DIAGNOSIS — Z6834 Body mass index (BMI) 34.0-34.9, adult: Secondary | ICD-10-CM | POA: Diagnosis not present

## 2019-08-08 DIAGNOSIS — E1165 Type 2 diabetes mellitus with hyperglycemia: Secondary | ICD-10-CM

## 2019-08-08 DIAGNOSIS — Z9641 Presence of insulin pump (external) (internal): Secondary | ICD-10-CM

## 2019-08-08 DIAGNOSIS — Z79899 Other long term (current) drug therapy: Secondary | ICD-10-CM | POA: Diagnosis not present

## 2019-08-08 DIAGNOSIS — J449 Chronic obstructive pulmonary disease, unspecified: Secondary | ICD-10-CM | POA: Diagnosis not present

## 2019-08-08 DIAGNOSIS — E785 Hyperlipidemia, unspecified: Secondary | ICD-10-CM | POA: Insufficient documentation

## 2019-08-08 DIAGNOSIS — E042 Nontoxic multinodular goiter: Secondary | ICD-10-CM | POA: Diagnosis not present

## 2019-08-08 DIAGNOSIS — R519 Headache, unspecified: Secondary | ICD-10-CM | POA: Diagnosis not present

## 2019-08-08 DIAGNOSIS — E088 Diabetes mellitus due to underlying condition with unspecified complications: Secondary | ICD-10-CM | POA: Diagnosis present

## 2019-08-08 DIAGNOSIS — H538 Other visual disturbances: Secondary | ICD-10-CM

## 2019-08-08 DIAGNOSIS — I6522 Occlusion and stenosis of left carotid artery: Secondary | ICD-10-CM | POA: Diagnosis not present

## 2019-08-08 LAB — CBC
HCT: 48.9 % (ref 39.0–52.0)
Hemoglobin: 16.2 g/dL (ref 13.0–17.0)
MCH: 30 pg (ref 26.0–34.0)
MCHC: 33.1 g/dL (ref 30.0–36.0)
MCV: 90.6 fL (ref 80.0–100.0)
Platelets: 202 10*3/uL (ref 150–400)
RBC: 5.4 MIL/uL (ref 4.22–5.81)
RDW: 12.7 % (ref 11.5–15.5)
WBC: 8.2 10*3/uL (ref 4.0–10.5)
nRBC: 0 % (ref 0.0–0.2)

## 2019-08-08 LAB — GLUCOSE, CAPILLARY
Glucose-Capillary: 308 mg/dL — ABNORMAL HIGH (ref 70–99)
Glucose-Capillary: 295 mg/dL — ABNORMAL HIGH (ref 70–99)

## 2019-08-08 LAB — URINALYSIS, ROUTINE W REFLEX MICROSCOPIC
Bilirubin Urine: NEGATIVE
Glucose, UA: >=500 mg/dL — AB
Hgb urine dipstick: NEGATIVE
Ketones, ur: NEGATIVE mg/dL
Leukocytes,Ua: NEGATIVE
Nitrite: NEGATIVE
Protein, ur: NEGATIVE mg/dL
Specific Gravity, Urine: 1.01 (ref 1.005–1.030)
pH: 6.5 (ref 5.0–8.0)

## 2019-08-08 LAB — RAPID URINE DRUG SCREEN, HOSP PERFORMED
Amphetamines: NOT DETECTED
Barbiturates: NOT DETECTED
Benzodiazepines: NOT DETECTED
Cocaine: NOT DETECTED
Opiates: NOT DETECTED
Tetrahydrocannabinol: NOT DETECTED

## 2019-08-08 LAB — DIFFERENTIAL
Abs Immature Granulocytes: 0.04 10*3/uL (ref 0.00–0.07)
Basophils Absolute: 0.1 10*3/uL (ref 0.0–0.1)
Basophils Relative: 1 %
Eosinophils Absolute: 0.2 10*3/uL (ref 0.0–0.5)
Eosinophils Relative: 2 %
Immature Granulocytes: 1 %
Lymphocytes Relative: 34 %
Lymphs Abs: 2.8 10*3/uL (ref 0.7–4.0)
Monocytes Absolute: 0.6 10*3/uL (ref 0.1–1.0)
Monocytes Relative: 7 %
Neutro Abs: 4.6 10*3/uL (ref 1.7–7.7)
Neutrophils Relative %: 55 %

## 2019-08-08 LAB — COMPREHENSIVE METABOLIC PANEL
ALT: 38 U/L (ref 0–44)
AST: 36 U/L (ref 15–41)
Albumin: 4.3 g/dL (ref 3.5–5.0)
Alkaline Phosphatase: 77 U/L (ref 38–126)
Anion gap: 8 (ref 5–15)
BUN: 13 mg/dL (ref 6–20)
CO2: 25 mmol/L (ref 22–32)
Calcium: 9.3 mg/dL (ref 8.9–10.3)
Chloride: 102 mmol/L (ref 98–111)
Creatinine, Ser: 1 mg/dL (ref 0.61–1.24)
GFR calc Af Amer: >60 mL/min (ref >60)
GFR calc non Af Amer: >60 mL/min (ref >60)
Glucose, Bld: 309 mg/dL — ABNORMAL HIGH (ref 70–99)
Potassium: 3.9 mmol/L (ref 3.5–5.1)
Sodium: 135 mmol/L (ref 135–145)
Total Bilirubin: 1.1 mg/dL (ref 0.3–1.2)
Total Protein: 6.9 g/dL (ref 6.5–8.1)

## 2019-08-08 LAB — URINALYSIS, MICROSCOPIC (REFLEX)
Bacteria, UA: NONE SEEN
RBC / HPF: NONE SEEN RBC/hpf (ref 0–5)

## 2019-08-08 LAB — HEMOGLOBIN A1C
Hgb A1c MFr Bld: 11.8 % — ABNORMAL HIGH (ref 4.8–5.6)
Mean Plasma Glucose: 291.96 mg/dL

## 2019-08-08 LAB — APTT: aPTT: 28 seconds (ref 24–36)

## 2019-08-08 LAB — PROTIME-INR
INR: 0.9 (ref 0.8–1.2)
Prothrombin Time: 12.4 seconds (ref 11.4–15.2)

## 2019-08-08 LAB — SEDIMENTATION RATE: Sed Rate: 4 mm/hr (ref 0–16)

## 2019-08-08 LAB — SARS CORONAVIRUS 2 (TAT 6-24 HRS): SARS Coronavirus 2: NEGATIVE

## 2019-08-08 LAB — ETHANOL: Alcohol, Ethyl (B): <10 mg/dL (ref <10)

## 2019-08-08 MED ORDER — ENOXAPARIN SODIUM 40 MG/0.4ML ~~LOC~~ SOLN: 40.0000 mg | SUBCUTANEOUS | Status: DC

## 2019-08-08 MED ORDER — INSULIN ASPART 100 UNIT/ML ~~LOC~~ SOLN
0.0000 [IU] | Freq: Three times a day (TID) | SUBCUTANEOUS | Status: DC
  Administered 2019-08-08 – 2019-08-09 (×2): 11 [IU] via SUBCUTANEOUS
  Administered 2019-08-09: 07:00:00 8 [IU] via SUBCUTANEOUS

## 2019-08-08 MED ORDER — STROKE: EARLY STAGES OF RECOVERY BOOK
Freq: Once | Status: AC
  Filled 2019-08-08: qty 1

## 2019-08-08 MED ORDER — ALBUTEROL SULFATE (2.5 MG/3ML) 0.083% IN NEBU: 3.0000 mL | INHALATION_SOLUTION | Freq: Four times a day (QID) | RESPIRATORY_TRACT | Status: DC | PRN

## 2019-08-08 MED ORDER — ACETAMINOPHEN 650 MG RE SUPP: 650.0000 mg | RECTAL | Status: DC | PRN

## 2019-08-08 MED ORDER — ACETAMINOPHEN 160 MG/5ML PO SOLN: 650.0000 mg | ORAL | Status: DC | PRN

## 2019-08-08 MED ORDER — SENNOSIDES-DOCUSATE SODIUM 8.6-50 MG PO TABS: 1.0000 | ORAL_TABLET | Freq: Every evening | ORAL | Status: DC | PRN

## 2019-08-08 MED ORDER — ENOXAPARIN SODIUM 40 MG/0.4ML ~~LOC~~ SOLN
40.0000 mg | SUBCUTANEOUS | Status: DC
  Administered 2019-08-08 – 2019-08-09 (×2): 40 mg via SUBCUTANEOUS
  Filled 2019-08-08 (×2): qty 0.4

## 2019-08-08 MED ORDER — ASPIRIN 325 MG PO TABS
325.0000 mg | ORAL_TABLET | Freq: Every day | ORAL | Status: DC
  Administered 2019-08-08 – 2019-08-09 (×2): 325 mg via ORAL
  Filled 2019-08-08 (×2): qty 1

## 2019-08-08 MED ORDER — FLUTICASONE FUROATE-VILANTEROL 100-25 MCG/INH IN AEPB
1.0000 | INHALATION_SPRAY | Freq: Every day | RESPIRATORY_TRACT | Status: DC
  Filled 2019-08-08: qty 28

## 2019-08-08 MED ORDER — INSULIN ASPART 100 UNIT/ML ~~LOC~~ SOLN
0.0000 [IU] | Freq: Every day | SUBCUTANEOUS | Status: DC
  Administered 2019-08-08: 3 [IU] via SUBCUTANEOUS

## 2019-08-08 MED ORDER — ATORVASTATIN CALCIUM 40 MG PO TABS
40.0000 mg | ORAL_TABLET | Freq: Every day | ORAL | Status: DC
  Administered 2019-08-09: 40 mg via ORAL
  Filled 2019-08-08: qty 1

## 2019-08-08 MED ORDER — ACETAMINOPHEN 325 MG PO TABS: 650.0000 mg | ORAL_TABLET | ORAL | Status: DC | PRN

## 2019-08-08 MED ORDER — TESTOSTERONE 50 MG/5GM (1%) TD GEL: 5.0000 g | Freq: Every day | TRANSDERMAL | Status: DC

## 2019-08-08 MED ORDER — ASPIRIN 300 MG RE SUPP: 300.0000 mg | Freq: Every day | RECTAL | Status: DC

## 2019-08-08 MED ORDER — IOHEXOL 350 MG/ML SOLN
100.0000 mL | Freq: Once | INTRAVENOUS | Status: AC | PRN
  Administered 2019-08-08: 11:00:00 100 mL via INTRAVENOUS

## 2019-08-08 NOTE — Consult Note (Addendum)
Neurology Consultation  Reason for Consult: R/o stroke, stroke workup Referring Physician: Dr. Chipper Herb   CC: left arm numbness and dizziness   History is obtained from: Patient, wife and medical chart   HPI: Lex Linhares Shader is a 54 y.o. male with past medical history of tobacco abuse, DM with insulin, HTN, HLD who presented to [facility] ED with c/o left arm numbness and tingling, and dizziness that started ~ 1 -2 weeks ago.  Patient states about 2 weeks ago he noticed intermittent left arm numbness and tingling.  He endorses that on Friday he noticed a short period of left arm and left leg weakness and numbness that had resolved.  He states that his left arm has  constant numbness and tingling and he is having trouble using his arm at times. He states there have been a couple of times when he would stand and get dizzy.  The dizziness is described as both the room spinning and him feeling dizzy.  He endorses blurry vision mainly with his right eye.  Wife states that the patient has been having a harder time sleeping and she has occasionally had to arouse him during the night because he seems to be not breathing right "almost like his tongue fell back". Patient denies slurred speech, facial droop, CP, SOB, cough, fever/chills, N/V, diarrhea, neck or head trauma.  Patient was transferred to [facility] and neurology consulted for stroke workup    LKW: ~1 week ago  tpa given?: no, outside TPA window  Premorbid modified Rankin scale (mRS):   1-No significant post stroke disability and can perform usual duties with stroke symptoms   ROS: A 14 point ROS was performed and is negative except as noted in the HPI.   Past Medical History:  Diagnosis Date  . Diabetes mellitus without complication (HCC)     Essential (primary) hypertension, Type 2 diabetes mellitus with hyperglycemia  and Obesity   Family History  Problem Relation Age of Onset  . Diabetes Maternal Grandmother   . Arthritis  Maternal Grandmother   . Arthritis Maternal Grandfather   . Arthritis Paternal Grandmother   . Arthritis Paternal Grandfather     Social History:   reports that he has been smoking cigarettes. He started smoking about 34 years ago. He has a 30.00 pack-year smoking history. He has never used smokeless tobacco. He reports that he does not drink alcohol or use drugs.   Medications  Current Facility-Administered Medications:  .   stroke: mapping our early stages of recovery book, , Does not apply, Once, Mikey College T, MD .  acetaminophen (TYLENOL) tablet 650 mg, 650 mg, Oral, Q4H PRN **OR** acetaminophen (TYLENOL) 160 MG/5ML solution 650 mg, 650 mg, Per Tube, Q4H PRN **OR** acetaminophen (TYLENOL) suppository 650 mg, 650 mg, Rectal, Q4H PRN, Mikey College T, MD .  albuterol (VENTOLIN HFA) 108 (90 Base) MCG/ACT inhaler 2 puff, 2 puff, Inhalation, Q6H PRN, Mikey College T, MD .  aspirin suppository 300 mg, 300 mg, Rectal, Daily **OR** aspirin tablet 325 mg, 325 mg, Oral, Daily, Zhang, Ping T, MD .  atorvastatin (LIPITOR) tablet 40 mg, 40 mg, Oral, Daily, Zhang, Ping T, MD .  enoxaparin (LOVENOX) injection 40 mg, 40 mg, Subcutaneous, Q24H, Zhang, Ping T, MD .  fluticasone furoate-vilanterol (BREO ELLIPTA) 100-25 MCG/INH 1 puff, 1 puff, Inhalation, Daily, Zhang, Ping T, MD .  insulin aspart (novoLOG) injection 0-15 Units, 0-15 Units, Subcutaneous, TID WC, Zhang, Ping T, MD .  insulin aspart (novoLOG) injection 0-5  Units, 0-5 Units, Subcutaneous, QHS, Zhang, Pearletha Forge T, MD .  senna-docusate (Senokot-S) tablet 1 tablet, 1 tablet, Oral, QHS PRN, Wynetta Fines T, MD .  testosterone (ANDROGEL) 50 MG/5GM (1%) gel 5 g, 5 g, Transdermal, Daily, Wynetta Fines T, MD   Exam: Current vital signs: BP 128/86 (BP Location: Right Arm)   Pulse 80   Temp 98.2 F (36.8 C) (Oral)   Resp 16   Ht 5\' 10"  (1.778 m)   Wt 102.7 kg   SpO2 97%   BMI 32.49 kg/m  Vital signs in last 24 hours: Temp:  [98.2 F (36.8 C)-98.8 F  (37.1 C)] 98.2 F (36.8 C) (03/01 1610) Pulse Rate:  [66-93] 80 (03/01 1610) Resp:  [12-17] 16 (03/01 1610) BP: (128-139)/(83-96) 128/86 (03/01 1610) SpO2:  [95 %-100 %] 97 % (03/01 1610) Weight:  [102.7 kg] 102.7 kg (03/01 1010)  GENERAL: Awake, alert in NAD HENT: - Normocephalic and atraumatic, dry mm Eyes: normal conjunctiva LUNGS - Clear to auscultation bilaterally with no wheezes CV - S1S2 RRR, no m/r/g, equal pulses bilaterally. ABDOMEN - Soft, nontender, nondistended with normoactive BS Ext: warm, well perfused, intact peripheral pulses, no edema Psych: normal affect and mood   NEURO:  Mental Status: AA&Ox3  Language: speech is clear.  Naming, repetition, fluency, and comprehension intact. Cranial Nerves: PERRL 38mm/brisk. EOMI, visual fields full, no facial asymmetry,facial sensation intact, hearing intact, tongue/uvula/soft palate midline, normal  sternocleidomastoid and trapezius muscle strength. No evidence of tongue atrophy or fibrillations Motor: 5/5 in all 4 extremities, left hand grip is weaker  Tone: is normal and bulk is normal Sensation- Intact to light touch bilaterally Coordination: FTN intact bilaterally, no ataxia in BLE. Gait- deferred  NIHSS 1a Level of Conscious.: 0 1b LOC Questions: 0 1c LOC Commands: 0 2 Best Gaze: 0 3 Visual: 0 4 Facial Palsy: 0 5a Motor Arm - left: 0 5b Motor Arm - Right: 0 6a Motor Leg - Left: 0 6b Motor Leg - Right: 0 7 Limb Ataxia: 0 8 Sensory: 1 9 Best Language: 0 10 Dysarthria: 0 11 Extinct. and Inatten.: 0 TOTAL: 1  Labs I have reviewed labs in epic and the results pertinent to this consultation are:  CBC    Component Value Date/Time   WBC 8.2 08/08/2019 1010   RBC 5.40 08/08/2019 1010   HGB 16.2 08/08/2019 1010   HCT 48.9 08/08/2019 1010   PLT 202 08/08/2019 1010   MCV 90.6 08/08/2019 1010   MCH 30.0 08/08/2019 1010   MCHC 33.1 08/08/2019 1010   RDW 12.7 08/08/2019 1010   LYMPHSABS 2.8 08/08/2019 1010    MONOABS 0.6 08/08/2019 1010   EOSABS 0.2 08/08/2019 1010   BASOSABS 0.1 08/08/2019 1010    CMP     Component Value Date/Time   NA 135 08/08/2019 1010   K 3.9 08/08/2019 1010   CL 102 08/08/2019 1010   CO2 25 08/08/2019 1010   GLUCOSE 309 (H) 08/08/2019 1010   BUN 13 08/08/2019 1010   CREATININE 1.00 08/08/2019 1010   CALCIUM 9.3 08/08/2019 1010   PROT 6.9 08/08/2019 1010   ALBUMIN 4.3 08/08/2019 1010   AST 36 08/08/2019 1010   ALT 38 08/08/2019 1010   ALKPHOS 77 08/08/2019 1010   BILITOT 1.1 08/08/2019 1010   GFRNONAA >60 08/08/2019 1010   GFRAA >60 08/08/2019 1010    Lipid Panel     Component Value Date/Time   CHOL 116 08/30/2008 2002   TRIG 74 08/30/2008 2002  HDL 33 (L) 08/30/2008 2002   CHOLHDL 3.5 Ratio 08/30/2008 2002   VLDL 15 08/30/2008 2002   LDLCALC 68 08/30/2008 2002     Imaging I have reviewed the images obtained:   CTA head/neck- no acute abnormality, Mild atherosclerosis of the left ICA bulb and both ICA siphons  Assessment:  Zaiyden Strozier Yoss is a 54 y.o. male with past medical history of tobacco abuse, DM with insulin, HTN, HLD who presented to [facility] ED with c/o left arm numbness and tingling, and dizziness that started ~ 1 -2 weeks ago.  Patient states about 2 weeks ago he noticed intermittent left arm numbness and tingling.  He endorses that on Friday he noticed a short period of left arm and left leg weakness and numbness that had resolved.  He states that his left arm has  constant numbness and tingling and he is having trouble using his arm at times  Possible right thalamic stoke vs neuropathy   Recommendations: - admit for stroke workup - HgbA1c, fasting lipid panel - MRI, MRA  of the brain without contrast - Frequent neuro checks - Echocardiogram - Carotid dopplers - Prophylactic therapy-Antiplatelet med: Aspirin - dose 325mg  PO or 300mg  PR - Risk factor modification - Telemetry monitoring - PT consult, OT consult,  Speech consult   . , DNP    NEUROHOSPITALIST ADDENDUM Performed a face to face diagnostic evaluation.   I have reviewed the contents of history and physical exam as documented by PA/ARNP/Resident and agree with above documentation.  I have discussed and formulated the above plan as documented. Edits to the note have been made as needed.  Patient with 1 and half weeks of left arm paresthesias, numbness followed by left sided weakness ( minimal). Possibly this is a subacute thalamic infarct, if MRI brain negative consider neuropathy secondary (mononeuritis multiplex), complex migraine.      Maryclare Nydam MD [facility] [place]   If 7pm to 7am, please call on call as listed on AMION.

## 2019-08-08 NOTE — Progress Notes (Signed)
54 y/o with Hx of HTN presented with dizziness and left arm numbness for over 1.5 weeks, became worse over 2-3 days. Neuro exam showing left sided weakness. CT and CTA no acute finding. Neurology Dr. Melissa Montane recommend admitted to [facility] for Stroke workup.

## 2019-08-08 NOTE — ED Notes (Signed)
No neuro changes; pt sts his LUE still feels weak; able to use LUE and has good strength as noted by observing pt holding and using tablet in bed.

## 2019-08-08 NOTE — Progress Notes (Signed)
Patient arrived to 3W07. Alert and oriented x4. Denies pain. Complains of some numbness and tingling on left side upper and lower extremities. POC provided to patient. Call bell within reach.

## 2019-08-08 NOTE — ED Provider Notes (Signed)
[facility]PT [facility] Emergency Department Provider Note MRN:  [id number]  Arrival date & time: 08/08/19     Chief Complaint   Arm Numbness and Dizziness   History of Present Illness   [name] is a 54 y.o. year-old male with a history of diabetes presenting to the ED with chief complaint of arm numbness and dizziness.  About 1 week of intermittent decreased sensation to the left arm.  3 days ago experienced decreased sensation and weakness to the left arm and left leg which has been constant since that time.  Also endorsing lightheadedness that is worse with standing, dull frontal headache.  Denies fever, no cough, no chest pain, no shortness of breath, no abdominal pain.  Blurred vision intermittently.  Review of Systems  A complete 10 system review of systems was obtained and all systems are negative except as noted in the HPI and PMH.   Patient's Health History    Past Medical History:  Diagnosis Date  . Diabetes mellitus without complication [facility])     Past Surgical History:  Procedure Laterality Date  . APPENDECTOMY      Family History  Problem Relation Age of Onset  . Diabetes Maternal Grandmother   . Arthritis Maternal Grandmother   . Arthritis Maternal Grandfather   . Arthritis Paternal Grandmother   . Arthritis Paternal Grandfather     Social History   Socioeconomic History  . Marital status: Married    Spouse name: Not on file  . Number of children: Not on file  . Years of education: Not on file  . Highest education level: Not on file  Occupational History  . Not on file  Tobacco Use  . Smoking status: Current Every Day Smoker    Packs/day: 1.00    Years: 30.00    Pack years: 30.00    Types: Cigarettes    Start date: 10/29/1984  . Smokeless tobacco: Never Used  Substance and Sexual Activity  . Alcohol use: No    Alcohol/week: 0.0 standard drinks  . Drug use: No  . Sexual activity: Yes  Other Topics Concern  . Not on  file  Social History Narrative  . Not on file   Social Determinants of Health   Financial Resource Strain:   . Difficulty of Paying Living Expenses: Not on file  Food Insecurity:   . Worried About Programme researcher, broadcasting/film/video in the Last Year: Not on file  . Ran Out of Food in the Last Year: Not on file  Transportation Needs:   . Lack of Transportation (Medical): Not on file  . Lack of Transportation (Non-Medical): Not on file  Physical Activity:   . Days of Exercise per Week: Not on file  . Minutes of Exercise per Session: Not on file  Stress:   . Feeling of Stress : Not on file  Social Connections:   . Frequency of Communication with Friends and Family: Not on file  . Frequency of Social Gatherings with Friends and Family: Not on file  . Attends Religious Services: Not on file  . Active Member of Clubs or Organizations: Not on file  . Attends Banker Meetings: Not on file  . Marital Status: Not on file  Intimate Partner Violence:   . Fear of Current or Ex-Partner: Not on file  . Emotionally Abused: Not on file  . Physically Abused: Not on file  . Sexually Abused: Not on file     Physical Exam  Vitals:   08/08/19 1200 08/08/19 1234  BP: 132/88 (!) 130/95  Pulse: 87 73  Resp: 17 12  Temp:    SpO2: 99% 100%    CONSTITUTIONAL: Well-appearing, NAD NEURO:  Alert and oriented x 3, normal speech, no aphasia, no dysarthria, mild decrease sensation to the left arm, left leg, mild to moderate decreased strength to the left leg, no pronator drift to the arms, no neglect, no visual field cuts EYES:  eyes equal and reactive ENT/NECK:  no LAD, no JVD CARDIO: Regular rate, well-perfused, normal S1 and S2 PULM:  CTAB no wheezing or rhonchi GI/GU:  normal bowel sounds, non-distended, non-tender MSK/SPINE:  No gross deformities, no edema SKIN:  no rash, atraumatic PSYCH:  Appropriate speech and behavior  *Additional and/or pertinent findings included in MDM  below  Diagnostic and Interventional Summary    EKG Interpretation  Date/Time:  Monday August 08 2019 10:03:30 EST Ventricular Rate:  94 PR Interval:    QRS Duration: 97 QT Interval:  338 QTC Calculation: 423 R Axis:   -38 Text Interpretation: Sinus rhythm Left axis deviation Low voltage, precordial leads Confirmed by Kennis Carina [id number]) on 08/08/2019 11:44:28 AM      Cardiac Monitoring Interpretation:  Labs Reviewed  COMPREHENSIVE METABOLIC PANEL - Abnormal; Notable for the following components:      Result Value   Glucose, Bld 309 (*)    All other components within normal limits  URINALYSIS, ROUTINE W REFLEX MICROSCOPIC - Abnormal; Notable for the following components:   Glucose, UA >=500 (*)    All other components within normal limits  SARS CORONAVIRUS 2 (TAT 6-24 HRS)  ETHANOL  PROTIME-INR  APTT  CBC  DIFFERENTIAL  RAPID URINE DRUG SCREEN, HOSP PERFORMED  URINALYSIS, MICROSCOPIC (REFLEX)    CT Angio Head W or Wo Contrast  Final Result    CT Angio Neck W and/or Wo Contrast  Final Result      Medications  iohexol (OMNIPAQUE) 350 MG/ML injection 100 mL (100 mLs Intravenous Contrast Given 08/08/19 1109)     Procedures  /  Critical Care .Critical Care Performed by: Sabas Sous, MD Authorized by: Sabas Sous, MD   Critical care provider statement:    Critical care time (minutes):  32   Critical care was necessary to treat or prevent imminent or life-threatening deterioration of the following conditions:  CNS failure or compromise (concern for acute ischemic stroke)   Critical care was time spent personally by me on the following activities:  Discussions with consultants, evaluation of patient's response to treatment, examination of patient, ordering and performing treatments and interventions, ordering and review of laboratory studies, ordering and review of radiographic studies, pulse oximetry, re-evaluation of patient's condition, obtaining history from  patient or surrogate and review of old charts    ED Course and Medical Decision Making  I have reviewed the triage vital signs, the nursing notes, and pertinent available records from the EMR.  Pertinent labs & imaging results that were available during my care of the patient were reviewed by me and considered in my medical decision making (see below for details).     Concern for acute ischemic stroke with onset 2 or 3 days ago, outside of the window for TPA, outside of the window for IR procedure, code stroke initiation is not indicated.  Will obtain CTA imaging and consult neurology, anticipating admission to the hospital service at [facility].  1 PM update: CTA imaging is without acute process.  Discussed  with Dr. Lorraine Lax of neurology, who agrees the patient is appropriate for hospitalist admission for stroke work-up and MRI.  Barth Kirks. Sedonia Small, [place] [email or web address]  Final Clinical Impressions(s) / ED Diagnoses     ICD-10-CM   1. Acute ischemic stroke [facility])  I63.9     ED Discharge Orders    None       Discharge Instructions Discussed with and Provided to Patient:   Discharge Instructions   None       Maudie Flakes, MD 08/08/19 1316

## 2019-08-08 NOTE — H&P (Signed)
History and Physical    [name] FYB:[id number] DOB: 09-14-1965 DOA: 08/08/2019  PCP: Mackie Pai, PA-C   Patient coming from: Home  I have personally briefly reviewed patient's old medical records in [place]  Chief Complaint: Left arm numbness, dizzyness  HPI: [name] is a 54 y.o. male with medical history significant of IDDM, HTN, HLD, presented to [facility] ED with chief complaint of arm numbness and dizziness.  Symptoms started about 1 week ago with intermittent pin and needle feeling to the left arm and left fingers.    3 days ago had a brief episode of both left arm and left leg numbness and weakness but resolved.  Left arm numbness has become constant, associated with dizziness, and blurry vision mainly in the mornings for last 2 days.   Complaining about right sided frontal headache, aching like intermittent.  ED Course: CTA shows mild atherosclerosis of the left ICA bulb and both ICA siphons, neurology recommend MRI and transferred to [place]: As per HPI otherwise 10 point review of systems negative.    Past Medical History:  Diagnosis Date  . Diabetes mellitus without complication [facility])     Past Surgical History:  Procedure Laterality Date  . APPENDECTOMY       reports that he has been smoking cigarettes. He started smoking about 34 years ago. He has a 30.00 pack-year smoking history. He has never used smokeless tobacco. He reports that he does not drink alcohol or use drugs.  No Known Allergies  Family History  Problem Relation Age of Onset  . Diabetes Maternal Grandmother   . Arthritis Maternal Grandmother   . Arthritis Maternal Grandfather   . Arthritis Paternal Grandmother   . Arthritis Paternal Grandfather      Prior to Admission medications   Medication Sig Start Date End Date Taking? Authorizing Provider  albuterol (VENTOLIN HFA) 108 (90 Base) MCG/ACT inhaler Inhale 2 puffs into the lungs  every 6 (six) hours as needed. 07/13/19   Saguier, Percell Miller, PA-C  aspirin EC 81 MG tablet Take 81 mg by mouth daily. Reported on 07/04/2015    [provider]  atorvastatin (LIPITOR) 10 MG tablet Take 1 tablet (10 mg total) by mouth daily. 01/07/19   Saguier, Percell Miller, PA-C  azithromycin (ZITHROMAX) 250 MG tablet Take 2 tablets by mouth on day 1, followed by 1 tablet by mouth daily for 4 days. 07/13/19   Saguier, Percell Miller, PA-C  benzonatate (TESSALON) 100 MG capsule Take 1 capsule (100 mg total) by mouth 3 (three) times daily as needed for cough. 07/13/19   Saguier, Percell Miller, PA-C  budesonide-formoterol (SYMBICORT) 80-4.5 MCG/ACT inhaler Inhale 2 puffs into the lungs 2 (two) times daily. 07/13/19   Saguier, Percell Miller, PA-C  glucose blood (TRUE METRIX BLOOD GLUCOSE TEST) test strip Use to check blood sugars twice daily 02/14/15   [provider]  HUMALOG 100 UNIT/ML injection  04/26/19   [provider]  Insulin Disposable Pump (OMNIPOD DASH 5 PACK PODS) MISC  05/20/19   [provider]  metFORMIN (GLUCOPHAGE) 500 MG tablet Take 500 mg by mouth 2 (two) times daily with a meal.    [provider]  sildenafil (VIAGRA) 100 MG tablet Take 1 tablet (100 mg total) by mouth daily as needed for erectile dysfunction. Max one tab in any 24 hour period. 10/28/16   Saguier, Percell Miller, PA-C  Testosterone (ANDROGEL) 20.25 MG/1.25GM (1.62%) GEL Apply the contents of two packets to the  upper body once daily 08/15/16   [provider]  varenicline (CHANTIX PAK) 0.5 MG X 11 & 1 MG X 42 tablet Take one 0.5 mg tablet by mouth once daily for 3 days, then increase to one 0.5 mg tablet twice daily for 4 days, then increase to one 1 mg tablet twice daily. 07/13/19   Mackie Pai, PA-C    Physical Exam: Vitals:   08/08/19 1234 08/08/19 1330 08/08/19 1600 08/08/19 1610  BP: (!) 130/95 (!) 139/96 135/83 128/86  Pulse: 73 66 82 80  Resp: 12 14  16   Temp:    98.2 F (36.8 C)  TempSrc:    Oral    SpO2: 100% 97%  97%  Weight:      Height:        Constitutional: NAD, calm, comfortable Vitals:   08/08/19 1234 08/08/19 1330 08/08/19 1600 08/08/19 1610  BP: (!) 130/95 (!) 139/96 135/83 128/86  Pulse: 73 66 82 80  Resp: 12 14  16   Temp:    98.2 F (36.8 C)  TempSrc:    Oral  SpO2: 100% 97%  97%  Weight:      Height:       Eyes: PERRL, lids and conjunctivae normal ENMT: Mucous membranes are moist. Posterior pharynx clear of any exudate or lesions.Normal dentition.  Neck: normal, supple, no masses, no thyromegaly Respiratory: clear to auscultation bilaterally, no wheezing, no crackles. Normal respiratory effort. No accessory muscle use.  Cardiovascular: Regular rate and rhythm, no murmurs / rubs / gallops. No extremity edema. 2+ pedal pulses. No carotid bruits.  Abdomen: no tenderness, no masses palpated. No hepatosplenomegaly. Bowel sounds positive.  Musculoskeletal: no clubbing / cyanosis. No joint deformity upper and lower extremities. Good ROM, no contractures. Normal muscle tone.  Skin: no rashes, lesions, ulcers. No induration Neurologic: CN 2-12 grossly intact.  Decreased left arm and finger sensation compared to the right side, DTR normal. Strength 5/5 in all 4.  Coordination is good, negative for nystagmus Psychiatric: Normal judgment and insight. Alert and oriented x 3. Normal mood.    Labs on Admission: I have personally reviewed following labs and imaging studies  CBC: Recent Labs  Lab 08/08/19 1010  WBC 8.2  NEUTROABS 4.6  HGB 16.2  HCT 48.9  MCV 90.6  PLT 993   Basic Metabolic Panel: Recent Labs  Lab 08/08/19 1010  NA 135  K 3.9  CL 102  CO2 25  GLUCOSE 309*  BUN 13  CREATININE 1.00  CALCIUM 9.3   GFR: Estimated Creatinine Clearance: 100.2 mL/min (by C-G formula based on SCr of 1 mg/dL). Liver Function Tests: Recent Labs  Lab 08/08/19 1010  AST 36  ALT 38  ALKPHOS 77  BILITOT 1.1  PROT 6.9  ALBUMIN 4.3   No results for input(s):  LIPASE, AMYLASE in the last 168 hours. No results for input(s): AMMONIA in the last 168 hours. Coagulation Profile: Recent Labs  Lab 08/08/19 1010  INR 0.9   Cardiac Enzymes: No results for input(s): CKTOTAL, CKMB, CKMBINDEX, TROPONINI in the last 168 hours. BNP (last 3 results) No results for input(s): PROBNP in the last 8760 hours. HbA1C: No results for input(s): HGBA1C in the last 72 hours. CBG: No results for input(s): GLUCAP in the last 168 hours. Lipid Profile: No results for input(s): CHOL, HDL, LDLCALC, TRIG, CHOLHDL, LDLDIRECT in the last 72 hours. Thyroid Function Tests: No results for input(s): TSH, T4TOTAL, FREET4, T3FREE, THYROIDAB in the last 72 hours. Anemia Panel: No  results for input(s): VITAMINB12, FOLATE, FERRITIN, TIBC, IRON, RETICCTPCT in the last 72 hours. Urine analysis:    Component Value Date/Time   COLORURINE YELLOW 08/08/2019 1230   APPEARANCEUR CLEAR 08/08/2019 1230   LABSPEC 1.010 08/08/2019 1230   PHURINE 6.5 08/08/2019 1230   GLUCOSEU >=500 (A) 08/08/2019 1230   HGBUR NEGATIVE 08/08/2019 1230   BILIRUBINUR NEGATIVE 08/08/2019 1230   KETONESUR NEGATIVE 08/08/2019 1230   PROTEINUR NEGATIVE 08/08/2019 1230   UROBILINOGEN 0.2 08/28/2014 2330   NITRITE NEGATIVE 08/08/2019 1230   LEUKOCYTESUR NEGATIVE 08/08/2019 1230    Radiological Exams on Admission: CT Angio Head W or Wo Contrast  Result Date: 08/08/2019 CLINICAL DATA:  54 year old male with dizziness and left arm numbness. Left side deficits for several days. Diabetes. EXAM: CT ANGIOGRAPHY HEAD AND NECK TECHNIQUE: Multidetector CT imaging of the head and neck was performed using the standard protocol during bolus administration of intravenous contrast. Multiplanar CT image reconstructions and MIPs were obtained to evaluate the vascular anatomy. Carotid stenosis measurements (when applicable) are obtained utilizing NASCET criteria, using the distal internal carotid diameter as the denominator.  CONTRAST:  170m OMNIPAQUE IOHEXOL 350 MG/ML SOLN [facility]  [facility] available[facility] FINDINGS: CT HEAD Brain: No midline shift, ventriculomegaly, mass effect, evidence of mass lesion, intracranial hemorrhage or evidence of cortically based acute infarction. Minimal to mild scattered white matter hypodensity. No cortical encephalomalacia identified. Calvarium and skull base: Negative. Paranasal sinuses: Visualized paranasal sinuses and mastoids are clear. Orbits: Visualized orbits and scalp soft tissues are within normal limits. CTA NECK Skeleton: Congenital incomplete ossification of the posterior C1 ring. No acute osseous abnormality identified. Upper chest: Negative. Other neck: Subcentimeter hypodense thyroid nodules do not meet size criteria for ultrasound follow-up. The glottis is closed. Some of the nasopharynx is also effaced at the soft palate. But no acute neck soft tissue findings are identified. Aortic arch: 3 vessel arch configuration with minimal arch atherosclerosis. Right carotid system: Negative. Left carotid system: Soft plaque in the posterior left ICA bulb but less than 50 % stenosis with respect to the distal vessel. Vertebral arteries: Normal proximal right subclavian artery and right vertebral artery origin. The right vertebral artery is patent to the skull base without plaque or stenosis. Normal proximal left subclavian artery and left vertebral artery origin. The left vertebral appears code dominant and is patent to the skull base without plaque or stenosis. CTA HEAD Posterior circulation: Normal vertebral artery V4 segments. Patent PICA origins. Normal vertebrobasilar junction. Patent basilar artery without stenosis. Normal SCA and PCA origins. Posterior communicating arteries are diminutive or absent. Bilateral PCA branches are within normal limits. Anterior circulation: Left ICA siphon is patent with mild  calcified plaque and no stenosis. Normal left ophthalmic artery origin. Right siphon is patent with similar mild cavernous segment calcified plaque, no stenosis. Normal right ophthalmic artery origin. A small distal right ICA infundibulum is identified on series 13, image 103 (normal variant). Patent carotid termini. Normal MCA and ACA origins. Dominant right ACA A1 segment. Anterior communicating artery and bilateral ACA branches are within normal limits. Left MCA M1 segment and bifurcation are patent without stenosis. Left MCA branches are within normal limits. Right MCA M1 segment and bifurcation are patent without stenosis. Right MCA branches are within normal limits. Venous sinuses: Patent. Anatomic variants: Dominant right ACA A1 segment. Review of the MIP images confirms the above findings IMPRESSION: 1. Mild atherosclerosis of the left ICA  bulb and both ICA siphons. But otherwise negative CTA Head and Neck. 2. No acute intracranial abnormality. Negative CT appearance of the brain. Electronically Signed   By: Genevie Ann M.D.   On: 08/08/2019 11:47   CT Angio Neck W and/or Wo Contrast  Result Date: 08/08/2019 CLINICAL DATA:  54 year old male with dizziness and left arm numbness. Left side deficits for several days. Diabetes. EXAM: CT ANGIOGRAPHY HEAD AND NECK TECHNIQUE: Multidetector CT imaging of the head and neck was performed using the standard protocol during bolus administration of intravenous contrast. Multiplanar CT image reconstructions and MIPs were obtained to evaluate the vascular anatomy. Carotid stenosis measurements (when applicable) are obtained utilizing NASCET criteria, using the distal internal carotid diameter as the denominator. CONTRAST:  125m OMNIPAQUE IOHEXOL 350 MG/ML SOLN [facility]PAR[facility]  [facility] available[facility] FINDINGS: CT HEAD Brain: No midline shift, ventriculomegaly, mass effect, evidence of mass lesion,  intracranial hemorrhage or evidence of cortically based acute infarction. Minimal to mild scattered white matter hypodensity. No cortical encephalomalacia identified. Calvarium and skull base: Negative. Paranasal sinuses: Visualized paranasal sinuses and mastoids are clear. Orbits: Visualized orbits and scalp soft tissues are within normal limits. CTA NECK Skeleton: Congenital incomplete ossification of the posterior C1 ring. No acute osseous abnormality identified. Upper chest: Negative. Other neck: Subcentimeter hypodense thyroid nodules do not meet size criteria for ultrasound follow-up. The glottis is closed. Some of the nasopharynx is also effaced at the soft palate. But no acute neck soft tissue findings are identified. Aortic arch: 3 vessel arch configuration with minimal arch atherosclerosis. Right carotid system: Negative. Left carotid system: Soft plaque in the posterior left ICA bulb but less than 50 % stenosis with respect to the distal vessel. Vertebral arteries: Normal proximal right subclavian artery and right vertebral artery origin. The right vertebral artery is patent to the skull base without plaque or stenosis. Normal proximal left subclavian artery and left vertebral artery origin. The left vertebral appears code dominant and is patent to the skull base without plaque or stenosis. CTA HEAD Posterior circulation: Normal vertebral artery V4 segments. Patent PICA origins. Normal vertebrobasilar junction. Patent basilar artery without stenosis. Normal SCA and PCA origins. Posterior communicating arteries are diminutive or absent. Bilateral PCA branches are within normal limits. Anterior circulation: Left ICA siphon is patent with mild calcified plaque and no stenosis. Normal left ophthalmic artery origin. Right siphon is patent with similar mild cavernous segment calcified plaque, no stenosis. Normal right ophthalmic artery origin. A small distal right ICA infundibulum is identified on series 13,  image 103 (normal variant). Patent carotid termini. Normal MCA and ACA origins. Dominant right ACA A1 segment. Anterior communicating artery and bilateral ACA branches are within normal limits. Left MCA M1 segment and bifurcation are patent without stenosis. Left MCA branches are within normal limits. Right MCA M1 segment and bifurcation are patent without stenosis. Right MCA branches are within normal limits. Venous sinuses: Patent. Anatomic variants: Dominant right ACA A1 segment. Review of the MIP images confirms the above findings IMPRESSION: 1. Mild atherosclerosis of the left ICA bulb and both ICA siphons. But otherwise negative CTA Head and Neck. 2. No acute intracranial abnormality. Negative CT appearance of the brain. Electronically Signed   By: HGenevie AnnM.D.   On: 08/08/2019 11:47    EKG: Independently reviewed.  Left axis deviation and low voltage  Assessment/Plan Active Problems:   CVA (cerebral vascular [place]) [place]  Left  arm arm numbness MRI ordered to rule out CVA Patient has been taking aspirin intermittently in the past, and he has a strong family history of CVA with his father and his brother. Aspirin and increase his statin A1c and lipid panel  IDDM, poorly controlled Will disable his insulin pump for today for MRI Sliding scale for now, add Lantus Patient follows endocrinology as outpatient  HLD As above  COPD No symptoms or signs of acute exacerbation Continue home meds  Right frontal headache with blurry vision Check ESR   DVT prophylaxis: Lovenox Code Status: Full code Family Communication: Discussed with wife at bedside Disposition Plan: If MRI negative patient likely can be discharged home tomorrow Consults called: Neurology was contacted Dr. Irwin Brakeman Admission status: Telemetry observation Lequita Halt MD [facility] Pager [phone]    08/08/2019, 4:58 PM

## 2019-08-08 NOTE — ED Triage Notes (Signed)
Pt states he has been having dizziness and left arm numbness for over 1.5 weeks.  Pt states left arm numbness x 2 weeks.  Dizzy for 1.5 weeks.

## 2019-08-09 ENCOUNTER — Observation Stay (HOSPITAL_COMMUNITY): Payer: 59

## 2019-08-09 ENCOUNTER — Observation Stay (HOSPITAL_BASED_OUTPATIENT_CLINIC_OR_DEPARTMENT_OTHER): Payer: 59

## 2019-08-09 DIAGNOSIS — E119 Type 2 diabetes mellitus without complications: Secondary | ICD-10-CM | POA: Diagnosis not present

## 2019-08-09 DIAGNOSIS — M50221 Other cervical disc displacement at C4-C5 level: Secondary | ICD-10-CM | POA: Diagnosis not present

## 2019-08-09 DIAGNOSIS — R200 Anesthesia of skin: Secondary | ICD-10-CM | POA: Diagnosis not present

## 2019-08-09 DIAGNOSIS — I6389 Other cerebral infarction: Secondary | ICD-10-CM

## 2019-08-09 DIAGNOSIS — R42 Dizziness and giddiness: Secondary | ICD-10-CM | POA: Diagnosis not present

## 2019-08-09 DIAGNOSIS — R292 Abnormal reflex: Secondary | ICD-10-CM | POA: Diagnosis not present

## 2019-08-09 DIAGNOSIS — I639 Cerebral infarction, unspecified: Secondary | ICD-10-CM | POA: Diagnosis not present

## 2019-08-09 DIAGNOSIS — F17210 Nicotine dependence, cigarettes, uncomplicated: Secondary | ICD-10-CM | POA: Diagnosis not present

## 2019-08-09 DIAGNOSIS — E785 Hyperlipidemia, unspecified: Secondary | ICD-10-CM | POA: Diagnosis not present

## 2019-08-09 DIAGNOSIS — G35 Multiple sclerosis: Secondary | ICD-10-CM | POA: Diagnosis not present

## 2019-08-09 DIAGNOSIS — E088 Diabetes mellitus due to underlying condition with unspecified complications: Secondary | ICD-10-CM

## 2019-08-09 DIAGNOSIS — Z79899 Other long term (current) drug therapy: Secondary | ICD-10-CM | POA: Diagnosis not present

## 2019-08-09 DIAGNOSIS — I10 Essential (primary) hypertension: Secondary | ICD-10-CM | POA: Diagnosis not present

## 2019-08-09 DIAGNOSIS — E669 Obesity, unspecified: Secondary | ICD-10-CM | POA: Diagnosis not present

## 2019-08-09 DIAGNOSIS — Z7951 Long term (current) use of inhaled steroids: Secondary | ICD-10-CM | POA: Diagnosis not present

## 2019-08-09 DIAGNOSIS — Z20822 Contact with and (suspected) exposure to covid-19: Secondary | ICD-10-CM | POA: Diagnosis not present

## 2019-08-09 LAB — LIPID PANEL
Cholesterol: 98 mg/dL (ref 0–200)
HDL: 25 mg/dL — ABNORMAL LOW (ref >40)
LDL Cholesterol: 52 mg/dL (ref 0–99)
Total CHOL/HDL Ratio: 3.9 RATIO
Triglycerides: 103 mg/dL (ref <150)
VLDL: 21 mg/dL (ref 0–40)

## 2019-08-09 LAB — ECHOCARDIOGRAM COMPLETE
Height: 70 in
Weight: 3622.4 oz

## 2019-08-09 LAB — GLUCOSE, CAPILLARY
Glucose-Capillary: 292 mg/dL — ABNORMAL HIGH (ref 70–99)
Glucose-Capillary: 348 mg/dL — ABNORMAL HIGH (ref 70–99)
Glucose-Capillary: 264 mg/dL — ABNORMAL HIGH (ref 70–99)

## 2019-08-09 LAB — HIV ANTIBODY (ROUTINE TESTING W REFLEX): HIV Screen 4th Generation wRfx: NONREACTIVE

## 2019-08-09 MED ORDER — ATORVASTATIN CALCIUM 10 MG PO TABS: 10.0000 mg | ORAL_TABLET | Freq: Every day | ORAL | Status: DC

## 2019-08-09 MED ORDER — SODIUM CHLORIDE 0.9 % IV SOLN: INTRAVENOUS | Status: DC

## 2019-08-09 MED ORDER — INSULIN GLARGINE 100 UNIT/ML ~~LOC~~ SOLN
15.0000 [IU] | Freq: Once | SUBCUTANEOUS | Status: AC
  Administered 2019-08-09: 16:00:00 15 [IU] via SUBCUTANEOUS
  Filled 2019-08-09: qty 0.15

## 2019-08-09 MED ORDER — ASPIRIN EC 81 MG PO TBEC: 81.0000 mg | DELAYED_RELEASE_TABLET | Freq: Every day | ORAL | Status: DC

## 2019-08-09 MED ORDER — INSULIN GLARGINE 100 UNIT/ML ~~LOC~~ SOLN
15.0000 [IU] | Freq: Every day | SUBCUTANEOUS | Status: DC
  Administered 2019-08-09: 15 [IU] via SUBCUTANEOUS
  Filled 2019-08-09: qty 0.15

## 2019-08-09 MED ORDER — GADOBUTROL 1 MMOL/ML IV SOLN
10.0000 mL | Freq: Once | INTRAVENOUS | Status: AC | PRN
  Administered 2019-08-09: 10 mL via INTRAVENOUS

## 2019-08-09 MED ORDER — INSULIN GLARGINE 100 UNIT/ML ~~LOC~~ SOLN
10.0000 [IU] | Freq: Every day | SUBCUTANEOUS | Status: DC
  Filled 2019-08-09: qty 0.1

## 2019-08-09 MED ORDER — INSULIN ASPART 100 UNIT/ML ~~LOC~~ SOLN
5.0000 [IU] | Freq: Three times a day (TID) | SUBCUTANEOUS | Status: DC
  Administered 2019-08-09 (×2): 5 [IU] via SUBCUTANEOUS

## 2019-08-09 NOTE — Progress Notes (Signed)
SLP Cancellation Note  Patient Details Name: [name] MRN: [id number] DOB: 1966-04-06   Cancelled treatment:       Reason Eval/Treat Not Completed: SLP screened, no needs identified, will sign off ST screened patient. His wife was present. Patient appears to be at his baseline in terms of speech/language/cognitive-linguistic skills. No needs identified. ST will sign off. Please re-consult should any new needs arise.   Marcanthony Sleight 08/09/2019, 11:44 AM  Shella Spearing, M.Ed., CCC-SLP Speech Therapy Acute Rehabilitation [phone]: Acute Rehab office [phone] - pager

## 2019-08-09 NOTE — Progress Notes (Signed)
  Echocardiogram 2D Echocardiogram has been performed.  [name] 08/09/2019, 11:45 AM

## 2019-08-09 NOTE — Evaluation (Signed)
Physical Therapy Evaluation Patient Details Name: [name] MRN: [id number] DOB: 1965-11-04 Today's Date: 08/09/2019   History of Present Illness  [name] is a 54 y.o. male with medical history significant of IDDM, HTN, HLD, presented to [facility] ED with chief complaint of L arm numbness, blurry vision, and dizziness.  Clinical Impression  Pt admitted with above diagnosis. Pt presents with weakness LLE compared to R and mild balance impairment when challenged. MMT 5/5 RLE, 4/5 L extensors, 4+/5 flexors with some tingling noted. Pt noted to have slower response time L side as well.  Pt currently with functional limitations due to the deficits listed below (see PT Problem List). Pt will benefit from skilled PT to increase their independence and safety with mobility to allow discharge to the venue listed below.       Follow Up Recommendations No PT follow up    Equipment Recommendations  None recommended by PT    Recommendations for Other Services       Precautions / Restrictions Precautions Precautions: None Restrictions Weight Bearing Restrictions: No      Mobility  Bed Mobility Overal bed mobility: Independent                Transfers Overall transfer level: Modified independent               General transfer comment: pt took a second to gain balance with initial standing and reports that he has been having to do that past few weeks because he has felt unstable when he first stands, no LOB  Ambulation/Gait Ambulation/Gait assistance: Supervision Gait Distance (Feet): 300 Feet Assistive device: None Gait Pattern/deviations: Step-through pattern Gait velocity: decreased Gait velocity interpretation: >2.62 ft/sec, indicative of community ambulatory General Gait Details: cautious at first but able to increase pace with time, no noticeable gait deviation on level ground ambulation  Stairs Stairs: Yes Stairs assistance: Supervision Stair  Management: One rail Right;Alternating pattern;Forwards Number of Stairs: 10 General stair comments: pt able to perform alternating pattern but noticed decreased L knee stability with last step. Also had pt complete step downs on bottom step for HEP and he had to use rail with UE support pulling for L step down.  Wheelchair Mobility    Modified Rankin (Stroke Patients Only) Modified Rankin (Stroke Patients Only) Pre-Morbid Rankin Score: No symptoms Modified Rankin: No significant disability     Balance Overall balance assessment: Needs assistance Sitting-balance support: No upper extremity supported Sitting balance-Leahy Scale: Good Sitting balance - Comments: posterior lean when performing LE MMT, self correction   Standing balance support: No upper extremity supported Standing balance-Leahy Scale: Good Standing balance comment: limitations evident when trying to maintain unilateral stance, has more difficulty maintaining on LLE                             Pertinent Vitals/Pain Pain Assessment: No/denies pain    Home Living Family/patient expects to be discharged to:: Private residence Living Arrangements: Spouse/significant other Available Help at Discharge: Family Type of Home: House Home Access: Stairs to enter   Secretary/administrator of Steps: 3 Home Layout: One level Home Equipment: None      Prior Function Level of Independence: Independent         Comments: works in IT here at [employer]   Dominant Hand: Right    Extremity/Trunk Assessment   Upper Extremity Assessment Upper Extremity Assessment:  Defer to OT evaluation LUE Deficits / Details: 3+/5 MMT, numbness and impaired coordination LUE Sensation: (numbness/tingling ) LUE Coordination: decreased fine motor;decreased gross motor    Lower Extremity Assessment Lower Extremity Assessment: LLE deficits/detail LLE Deficits / Details: hip flex 4/5, hip abd 4/5, hip add  4+/5, knee ext 4/5, knee flex 4+/5, ankle 4+/5, noticeable difference compared to RLE which is 5/5 throughout LLE Sensation: decreased light touch(mild numbness) LLE Coordination: WNL    Cervical / Trunk Assessment Cervical / Trunk Assessment: Normal  Communication   Communication: No difficulties  Cognition Arousal/Alertness: Awake/alert Behavior During Therapy: WFL for tasks assessed/performed Overall Cognitive Status: Within Functional Limits for tasks assessed                                        General Comments General comments (skin integrity, edema, etc.): discussed stroke sxs    Exercises Other Exercises Other Exercises: provided [facility] handout, completing exercise: grasp, tip to tip, thumb circles, finger isolation extension; encouarged completion 2x/day  Other Exercises: step downs x5 each LE Other Exercises: squats x10   Assessment/Plan    PT Assessment Patient needs continued PT services  PT Problem List Decreased strength;Decreased balance;Impaired sensation       PT Treatment Interventions Gait training;Stair training;Functional mobility training;Therapeutic activities;Therapeutic exercise;Balance training;Patient/family education    PT Goals (Current goals can be found in the Care Plan section)  Acute Rehab PT Goals Patient Stated Goal: to get better PT Goal Formulation: With patient Time For Goal Achievement: 08/23/19 Potential to Achieve Goals: Good    Frequency Min 4X/week   Barriers to discharge        Co-evaluation PT/OT/SLP Co-Evaluation/Treatment: Yes Reason for Co-Treatment: Complexity of the patient's impairments (multi-system involvement);Necessary to address cognition/behavior during functional activity PT goals addressed during session: Mobility/safety with mobility;Balance;Strengthening/ROM OT goals addressed during session: ADL's and self-care       AM-PAC PT "6 Clicks" Mobility  Outcome Measure Help needed turning  from your back to your side while in a flat bed without using bedrails?: None Help needed moving from lying on your back to sitting on the side of a flat bed without using bedrails?: None Help needed moving to and from a bed to a chair (including a wheelchair)?: None Help needed standing up from a chair using your arms (e.g., wheelchair or bedside chair)?: None Help needed to walk in hospital room?: None Help needed climbing 3-5 steps with a railing? : A Little 6 Click Score: 23    End of Session   Activity Tolerance: Patient tolerated treatment well Patient left: in bed;with call bell/phone within reach Nurse Communication: Mobility status PT Visit Diagnosis: Unsteadiness on feet (R26.81)    Time: 8250-5397 PT Time Calculation (min) (ACUTE ONLY): 37 min   Charges:   PT Evaluation $PT Eval Moderate Complexity: 1 Mod          [place], [place]  Pager [phone] Office [place] 08/09/2019, 1:41 PM

## 2019-08-09 NOTE — Progress Notes (Signed)
  Echocardiogram 2D Echocardiogram was attempted but the patient was going to MRI. We will try again as our schedule permits.   [name] 08/09/2019, 10:00 AM

## 2019-08-09 NOTE — Progress Notes (Signed)
Patient being discharged home with self care. Education and information provided. IV removed. CCMD notified. All belongings with patient. Leaving unit ambulatory with RN's.

## 2019-08-09 NOTE — Progress Notes (Signed)
SLP Cancellation Note  Patient Details Name: [name] MRN: [id number] DOB: 1965/08/10   Cancelled treatment:       Reason Eval/Treat Not Completed: Patient at procedure or test/unavailable SLE attempted, pt unavailable (working with PT). ST will continue efforts.   Shella Spearing, M.Ed., CCC-SLP Speech Therapy Acute Rehabilitation [phone]: Acute Rehab office [phone] - pager    Shella Spearing 08/09/2019, 10:44 AM

## 2019-08-09 NOTE — Evaluation (Signed)
Occupational Therapy Evaluation Patient Details Name: [name] MRN: [id number] DOB: [birth year] Today's Date: 08/09/2019    History of Present Illness [name] is a 54 y.o. male with medical history significant of IDDM, HTN, HLD, presented to [facility] ED with chief complaint of L arm numbness, blurry vision, and dizziness.   Clinical Impression   PTA patient independent. Admitted for above and limited by problem list below, including blurry vision, L sided weakness, numbness and impaired coordination. He is completing basic transfers with modified independence, in room mobility with supervision to modified independence, and ADls with supervision.  Provided [facility] HEP for L UE coordination/reviewed exercises below, educated on using L UE as much as possible. He will benefit from continued OT services while admitted to optimize L UE functional use for everyday ADLs, IADLs and return to work (IT). Will follow acutely.     Follow Up Recommendations  No OT follow up    Equipment Recommendations  None recommended by OT    Recommendations for Other Services       Precautions / Restrictions Restrictions Weight Bearing Restrictions: No      Mobility Bed Mobility Overal bed mobility: Independent                Transfers Overall transfer level: Modified independent                    Balance Overall balance assessment: No apparent balance deficits (not formally assessed)                                         ADL either performed or assessed with clinical judgement   ADL Overall ADL's : Needs assistance/impaired     Grooming: Supervision/safety;Standing   Upper Body Bathing: Sitting;Modified independent   Lower Body Bathing: Supervison/ safety;Sitting/lateral leans   Upper Body Dressing : Sitting;Modified independent   Lower Body Dressing: Supervision/safety;Sit to/from stand   Toilet Transfer: Ambulation;Modified  Independent           Functional mobility during ADLs: Supervision/safety General ADL Comments: pt limited by L sided weakness and coordination      Vision Baseline Vision/History: Wears glasses Wears Glasses: Reading only Patient Visual Report: Blurring of vision Vision Assessment?: Yes Eye Alignment: Within Functional Limits Ocular Range of Motion: Within Functional Limits Alignment/Gaze Preference: Within Defined Limits Tracking/Visual Pursuits: Able to track stimulus in all quads without difficulty Visual Fields: No apparent deficits Additional Comments: reports blurry vision > L eye      Perception     Praxis      Pertinent Vitals/Pain Pain Assessment: No/denies pain     Hand Dominance Right   Extremity/Trunk Assessment Upper Extremity Assessment Upper Extremity Assessment: LUE deficits/detail LUE Deficits / Details: 3+/5 MMT, numbness and impaired coordination LUE Sensation: (numbness/tingling ) LUE Coordination: decreased fine motor;decreased gross motor   Lower Extremity Assessment Lower Extremity Assessment: Defer to PT evaluation   Cervical / Trunk Assessment Cervical / Trunk Assessment: Normal   Communication Communication Communication: No difficulties   Cognition Arousal/Alertness: Awake/alert Behavior During Therapy: WFL for tasks assessed/performed Overall Cognitive Status: Within Functional Limits for tasks assessed                                     General Comments  Exercises Exercises: Other exercises Other Exercises Other Exercises: provided [facility] handout, completing exercise: grasp, tip to tip, thumb circles, finger isolation extension; encouarged completion 2x/day    Shoulder Instructions      Home Living Family/patient expects to be discharged to:: Private residence Living Arrangements: Spouse/significant other Available Help at Discharge: Family Type of Home: House Home Access: Stairs to enter Water quality scientist of Steps: 3   Home Layout: One level     Bathroom Shower/Tub: Chief Strategy Officer: Standard     Home Equipment: None          Prior Functioning/Environment Level of Independence: Independent        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 [employer]         OT Problem List: Decreased strength;Decreased coordination;Impaired vision/perception;Impaired UE functional use;Impaired sensation      OT Treatment/Interventions: Self-care/ADL training;Therapeutic activities;Neuromuscular education;Patient/family education    OT Goals(Current goals can be found in the care plan section) Acute Rehab OT Goals Patient Stated Goal: to get better OT Goal Formulation: With patient Time For Goal Achievement: 08/23/19 Potential to Achieve Goals: Good  OT Frequency: Min 2X/week   Barriers to D/C:            Co-evaluation PT/OT/SLP Co-Evaluation/Treatment: Yes Reason for Co-Treatment: For patient/therapist safety;To address functional/ADL transfers   OT goals addressed during session: ADL's and self-care      AM-PAC OT "6 Clicks" Daily Activity     Outcome Measure Help from another person eating meals?: None Help from another person taking care of personal grooming?: None Help from another person toileting, which includes using toliet, bedpan, or urinal?: None Help from another person bathing (including washing, rinsing, drying)?: None Help from another person to put on and taking off regular upper body clothing?: None Help from another person to put on and taking off regular lower body clothing?: None 6 Click Score: 24   End of Session    Activity Tolerance: Patient tolerated treatment well Patient left: in bed;with call bell/phone within reach  OT Visit Diagnosis: Muscle weakness (generalized) (M62.81);Other (comment)(sensation and coordination)                Time: 1030-1100 OT Time Calculation (min): 30 min Charges:  OT General Charges $OT Visit: 1  Visit OT Evaluation $OT Eval Low Complexity: 1 Low  Barry Brunner, OT Acute Rehabilitation Services Pager [phone] Office [phone]   Chancy Milroy 08/09/2019, 11:40 AM

## 2019-08-09 NOTE — Progress Notes (Addendum)
Inpatient Diabetes Program Recommendations  AACE/ADA: New Consensus Statement on Inpatient Glycemic Control (2015)  Target Ranges:  Prepandial:   less than 140 mg/dL      Peak postprandial:   less than 180 mg/dL (1-2 hours)      Critically ill patients:  140 - 180 mg/dL   Lab Results  Component Value Date   GLUCAP 348 (H) 08/09/2019   HGBA1C 11.8 (H) 08/08/2019    Reviewed patients Omnipod settings at bedside. Patient states that he has Type 2 DM however his endocrinologist states he needs insulin. He is currently not wearing Pod but states that he started the insulin pump approximately 1 month ago.  Basal settings:  1.45 units/ hr (24 hour total=34.8 units) Bolus: 1 unit/6 grams of CHO             1 unit drops blood sugar approximately 30 mg/dL with goal of 458 mg/dL  Patient states that he is doing well on new insulin pump and is in the process of also obtaining Dexcom G6 sensor.  We discussed that he likely needs more basal insulin to match the basal insulin that his pump administers.  He will need to wait approximately 24 hours prior to restarting OmniPod due to basal insulin on board.   -Consider adding one time dose of Lantus 15 units now.  -Increase Novolog meal coverage to 7 units tid with meals.  -Change Novolog correction to sensitive q 4 hours.   Sent message to MD.    Thanks  Beryl Meager, RN, BC-ADM Inpatient Diabetes Coordinator Pager [phone] (8a-5p)

## 2019-08-09 NOTE — Progress Notes (Signed)
Inpatient Diabetes Program Recommendations  AACE/ADA: New Consensus Statement on Inpatient Glycemic Control (2015)  Target Ranges:  Prepandial:   less than 140 mg/dL      Peak postprandial:   less than 180 mg/dL (1-2 hours)      Critically ill patients:  140 - 180 mg/dL   Lab Results  Component Value Date   GLUCAP 292 (H) 08/09/2019   HGBA1C 11.8 (H) 08/08/2019    Review of Glycemic Control Results for [name], [name] "[name]" (MRN [id number]) as of 08/09/2019 09:26  Ref. Range 08/08/2019 17:34 08/08/2019 21:58 08/09/2019 06:38  Glucose-Capillary Latest Ref Range: 70 - 99 mg/dL 048 (H) 889 (H) 169 (H)   Diabetes history: DM2 Outpatient Diabetes medications: Insulin pump (Humalog 10-13 units tid meal coverage) + Metformin 500 mg qd Current orders for Inpatient glycemic control: Lantus 10 units qd + Novolog moderate correction tid + hs 0-5 units  Inpatient Diabetes Program Recommendations:   -Increase Lantus to 15 units qd (50 % basal insulin dose) -Add Novolog 5 units tid meal coverage if eats 50% meals  Will plan to see patient in regards to A1c 11.8. Patient spoke with Cristy Folks on 04/13/19 in regards to education on Omnipod & V-Go insulin pumps.  Thank you, Billy Fischer. Jeffory Snelgrove, RN, MSN, CDE  Diabetes Coordinator Inpatient Glycemic Control Team Team Pager [phone] (8am-5pm) 08/09/2019 9:35 AM

## 2019-08-09 NOTE — Progress Notes (Signed)
STROKE TEAM PROGRESS NOTE   INTERVAL HISTORY Currently working with PT. States L arm completely numb since last Thursday. No face involvement, some in the leg. Also with eye droop.  I personally reviewed history of presenting illness with the patient in detail, imaging feeling in PACS and electronic medical records.  He has had subacute fluctuating paresthesias in the left arm more than the left leg and some mild postural dizziness without true vertigo.  He also complains of some blurred vision in the right eye  Vitals:   08/08/19 2343 08/09/19 0406 08/09/19 0726 08/09/19 0823  BP: 129/82 136/86 120/78   Pulse: 74 73 67   Resp: 19 18 12 15   Temp: 98 F (36.7 C) 98.5 F (36.9 C) 98 F (36.7 C)   TempSrc: Oral Oral Oral   SpO2: 98% 98% 98%   Weight:      Height:        CBC:  Recent Labs  Lab 08/08/19 1010  WBC 8.2  NEUTROABS 4.6  HGB 16.2  HCT 48.9  MCV 90.6  PLT 034    Basic Metabolic Panel:  Recent Labs  Lab 08/08/19 1010  NA 135  K 3.9  CL 102  CO2 25  GLUCOSE 309*  BUN 13  CREATININE 1.00  CALCIUM 9.3   Lipid Panel:     Component Value Date/Time   CHOL 98 08/09/2019 0258   TRIG 103 08/09/2019 0258   HDL 25 (L) 08/09/2019 0258   CHOLHDL 3.9 08/09/2019 0258   VLDL 21 08/09/2019 0258   LDLCALC 52 08/09/2019 0258   HgbA1c:  Lab Results  Component Value Date   HGBA1C 11.8 (H) 08/08/2019   Urine Drug Screen:     Component Value Date/Time   LABOPIA NONE DETECTED 08/08/2019 1230   COCAINSCRNUR NONE DETECTED 08/08/2019 1230   COCAINSCRNUR (A) 07/13/2008 2220    POSITIVE (NOTE) Sent for confirmatory testing Result repeated and verified.   LABBENZ NONE DETECTED 08/08/2019 1230   LABBENZ NEGATIVE 07/13/2008 2220   AMPHETMU NONE DETECTED 08/08/2019 1230   THCU NONE DETECTED 08/08/2019 1230   LABBARB NONE DETECTED 08/08/2019 1230    Alcohol Level     Component Value Date/Time   ETH <10 08/08/2019 1010    IMAGING past 24 hours CT Angio Head W or Wo  Contrast  Result Date: 08/08/2019 CLINICAL DATA:  54 year old male with dizziness and left arm numbness. Left side deficits for several days. Diabetes. EXAM: CT ANGIOGRAPHY HEAD AND NECK TECHNIQUE: Multidetector CT imaging of the head and neck was performed using the standard protocol during bolus administration of intravenous contrast. Multiplanar CT image reconstructions and MIPs were obtained to evaluate the vascular anatomy. Carotid stenosis measurements (when applicable) are obtained utilizing NASCET criteria, using the distal internal carotid diameter as the denominator. CONTRAST:  117m OMNIPAQUE IOHEXOL 350 MG/ML SOLN [facility]  [facility] available[facility] FINDINGS: CT HEAD Brain: No midline shift, ventriculomegaly, mass effect, evidence of mass lesion, intracranial hemorrhage or evidence of cortically based acute infarction. Minimal to mild scattered white matter hypodensity. No cortical encephalomalacia identified. Calvarium and skull base: Negative. Paranasal sinuses: Visualized paranasal sinuses and mastoids are clear. Orbits: Visualized orbits and scalp soft tissues are within normal limits. CTA NECK Skeleton: Congenital incomplete ossification of the posterior C1 ring. No acute osseous abnormality identified. Upper chest: Negative. Other neck: Subcentimeter hypodense thyroid nodules do not meet size criteria for ultrasound follow-up. The glottis  is closed. Some of the nasopharynx is also effaced at the soft palate. But no acute neck soft tissue findings are identified. Aortic arch: 3 vessel arch configuration with minimal arch atherosclerosis. Right carotid system: Negative. Left carotid system: Soft plaque in the posterior left ICA bulb but less than 50 % stenosis with respect to the distal vessel. Vertebral arteries: Normal proximal right subclavian artery and right vertebral artery origin. The right vertebral artery is  patent to the skull base without plaque or stenosis. Normal proximal left subclavian artery and left vertebral artery origin. The left vertebral appears code dominant and is patent to the skull base without plaque or stenosis. CTA HEAD Posterior circulation: Normal vertebral artery V4 segments. Patent PICA origins. Normal vertebrobasilar junction. Patent basilar artery without stenosis. Normal SCA and PCA origins. Posterior communicating arteries are diminutive or absent. Bilateral PCA branches are within normal limits. Anterior circulation: Left ICA siphon is patent with mild calcified plaque and no stenosis. Normal left ophthalmic artery origin. Right siphon is patent with similar mild cavernous segment calcified plaque, no stenosis. Normal right ophthalmic artery origin. A small distal right ICA infundibulum is identified on series 13, image 103 (normal variant). Patent carotid termini. Normal MCA and ACA origins. Dominant right ACA A1 segment. Anterior communicating artery and bilateral ACA branches are within normal limits. Left MCA M1 segment and bifurcation are patent without stenosis. Left MCA branches are within normal limits. Right MCA M1 segment and bifurcation are patent without stenosis. Right MCA branches are within normal limits. Venous sinuses: Patent. Anatomic variants: Dominant right ACA A1 segment. Review of the MIP images confirms the above findings IMPRESSION: 1. Mild atherosclerosis of the left ICA bulb and both ICA siphons. But otherwise negative CTA Head and Neck. 2. No acute intracranial abnormality. Negative CT appearance of the brain. Electronically Signed   By: Genevie Ann M.D.   On: 08/08/2019 11:47   CT Angio Neck W and/or Wo Contrast  Result Date: 08/08/2019 CLINICAL DATA:  54 year old male with dizziness and left arm numbness. Left side deficits for several days. Diabetes. EXAM: CT ANGIOGRAPHY HEAD AND NECK TECHNIQUE: Multidetector CT imaging of the head and neck was performed using  the standard protocol during bolus administration of intravenous contrast. Multiplanar CT image reconstructions and MIPs were obtained to evaluate the vascular anatomy. Carotid stenosis measurements (when applicable) are obtained utilizing NASCET criteria, using the distal internal carotid diameter as the denominator. CONTRAST:  153m OMNIPAQUE IOHEXOL 350 MG/ML SOLN [facility]  [facility] available[facility] FINDINGS: CT HEAD Brain: No midline shift, ventriculomegaly, mass effect, evidence of mass lesion, intracranial hemorrhage or evidence of cortically based acute infarction. Minimal to mild scattered white matter hypodensity. No cortical encephalomalacia identified. Calvarium and skull base: Negative. Paranasal sinuses: Visualized paranasal sinuses and mastoids are clear. Orbits: Visualized orbits and scalp soft tissues are within normal limits. CTA NECK Skeleton: Congenital incomplete ossification of the posterior C1 ring. No acute osseous abnormality identified. Upper chest: Negative. Other neck: Subcentimeter hypodense thyroid nodules do not meet size criteria for ultrasound follow-up. The glottis is closed. Some of the nasopharynx is also effaced at the soft palate. But no acute neck soft tissue findings are identified. Aortic arch: 3 vessel arch configuration with minimal arch atherosclerosis. Right carotid system: Negative. Left carotid system: Soft plaque in the posterior left ICA bulb but less than 50 % stenosis with respect to the distal vessel. Vertebral arteries: Normal proximal  right subclavian artery and right vertebral artery origin. The right vertebral artery is patent to the skull base without plaque or stenosis. Normal proximal left subclavian artery and left vertebral artery origin. The left vertebral appears code dominant and is patent to the skull base without plaque or stenosis. CTA HEAD Posterior circulation: Normal  vertebral artery V4 segments. Patent PICA origins. Normal vertebrobasilar junction. Patent basilar artery without stenosis. Normal SCA and PCA origins. Posterior communicating arteries are diminutive or absent. Bilateral PCA branches are within normal limits. Anterior circulation: Left ICA siphon is patent with mild calcified plaque and no stenosis. Normal left ophthalmic artery origin. Right siphon is patent with similar mild cavernous segment calcified plaque, no stenosis. Normal right ophthalmic artery origin. A small distal right ICA infundibulum is identified on series 13, image 103 (normal variant). Patent carotid termini. Normal MCA and ACA origins. Dominant right ACA A1 segment. Anterior communicating artery and bilateral ACA branches are within normal limits. Left MCA M1 segment and bifurcation are patent without stenosis. Left MCA branches are within normal limits. Right MCA M1 segment and bifurcation are patent without stenosis. Right MCA branches are within normal limits. Venous sinuses: Patent. Anatomic variants: Dominant right ACA A1 segment. Review of the MIP images confirms the above findings IMPRESSION: 1. Mild atherosclerosis of the left ICA bulb and both ICA siphons. But otherwise negative CTA Head and Neck. 2. No acute intracranial abnormality. Negative CT appearance of the brain. Electronically Signed   By: Genevie Ann M.D.   On: 08/08/2019 11:47   MR BRAIN WO CONTRAST  Result Date: 08/09/2019 CLINICAL DATA:  Dizziness and left arm numbness EXAM: MRI HEAD WITHOUT CONTRAST TECHNIQUE: Multiplanar, multiecho pulse sequences of the brain and surrounding structures were obtained without intravenous contrast. COMPARISON:  None. FINDINGS: Brain: There is no acute infarction or intracranial hemorrhage. There is no intracranial mass, mass effect, or edema. There is no hydrocephalus or extra-axial fluid collection. Minimal patchy T2 hyperintensity in the supratentorial white matter is nonspecific but may  reflect minor chronic microvascular ischemic changes. Vascular: Major vessel flow voids at the skull base are preserved. Skull and upper cervical spine: Normal marrow signal is preserved. Sinuses/Orbits: Paranasal sinuses are aerated. Orbits are unremarkable. Other: Sella is unremarkable.  Mastoid air cells are clear. IMPRESSION: No evidence of recent infarction, hemorrhage, or mass. Electronically Signed   By: Macy Mis M.D.   On: 08/09/2019 10:35    PHYSICAL EXAM Pleasant well-built middle-aged African-American male not in distress. . Afebrile. Head is nontraumatic. Neck is supple without bruit.    Cardiac exam no murmur or gallop. Lungs are clear to auscultation. Distal pulses are well felt. Neurological Exam :  He is awake alert oriented to time place and person.  Speech and language appear normal.  Extraocular movements are full range without nystagmus.  Pupils are both 4 mm equal reactive without afferent pupillary defect.  Vision acuity seems adequate on bedside testing and there is no field defect noted.  Face is symmetric without weakness.  Tongue midline.  Motor system exam shows no upper or lower extremity drift but mild subjective weakness of left leg but effort is variable and not great.  Mild subjective paresthesias in the left hand and forearm and to a lesser degree in the left leg.  Deep tendon flexes a brisk and asymmetric and brisker on the left compared to the right.  Plantars are downgoing.  Gait is slow cautious but steady ASSESSMENT/PLAN Mr. [name] is a 54  y.o. male with history of tobacco use, DB, HTN, HLD presenting with L arm numbness, transient L leg numbness with eye droop and occasional dizziness when stands.   Possible cervical spinal disease, doubt TIA, no Stroke  Has L sided hyperrelexia  CTA head & neck mild L ICA bulb and B ICA siphon atherosclerosis. No acute abnormality  MRI  No acute abnormality  MRI CSpine pending    2D Echo EF 60-65%. No  source of embolus   LDL 52  HgbA1c 11.8  Lovenox 40 mg sq daily for VTE prophylaxis  aspirin 81 mg daily prior to admission, now on aspirin 325 mg daily. Will return to home dose  Therapy recommendations:  No therapy needs  Disposition:  Return home  Hypertension  Stable . BP goal normotensive  Hyperlipidemia  Home meds:  lipitor 10  Now on lipitor 40  LDL 52  Return to home dose lipitor and continue statin at discharge  Diabetes type II Uncontrolled  HgbA1c 11.8, goal < 7.0  Other Stroke Risk Factors  Cigarette smoker, advised to stop smoking  Obesity, Body mass index is 32.49 kg/m., recommend weight loss, diet and exercise as appropriate   Other Active Problems  COPD  R frontal HA, ESR pending   Hospital day # 1 He presented with subacute paresthesias on the left side with some subjective complaints of dizziness and blurred vision neurological exam is fairly benign except some mild giveaway weakness on the left and subjective paresthesias.  There is subtle brisk hyperreflexia hence I recommend ruling out compressive cervical cord lesion by checking MRI scan of the brain with and without contrast to look for demyelinating or compressive lesions.  Greater than 50% time during this 35-minute visit was spent on counseling and coordination of care about his numbness and dizziness and answering questions.  Discussed with Dr. Dorina Hoyer, MD To contact Stroke Continuity provider, please refer to [email or web address] After hours, contact General Neurology

## 2019-08-09 NOTE — Discharge Summary (Signed)
Physician Discharge Summary  [name] HYI:[id number] DOB: [birth year] DOA: 08/08/2019  PCP: Esperanza Richters, PA-C  Admit date: 08/08/2019 Discharge date: 08/09/2019  Admitted From: Home Discharge disposition: Home   Recommendations for Outpatient Follow-Up:   1. Patient to continue work on better blood sugar control 2. Referral has been placed to neurology for consideration of EMG/nerve conduction study to investigate abnormal physical exam findings of hyperreflexia: per Dr. Pearlean Brownie: recommend ruling out compressive cervical cord lesion by checking MRI scan of the brain with and without contrast to look for demyelinating or compressive lesions   Discharge Diagnosis:   Active Problems:   Diabetes mellitus due to underlying condition with unspecified complications (HCC)   Hyperreflexia    Discharge Condition: Improved.  Diet recommendation:Carbohydrate-modified  Wound care: None.  Code status: Full.   History of Present Illness:   [name] is a 54 y.o. male with medical history significant of IDDM, HTN, HLD,presented to [facility] ED with chief complaint of arm numbness and dizziness.  Symptoms started about 1 week ago with intermittent pin and needle feeling to the left arm and left fingers.   3 days ago had a brief episode of both left arm and left leg numbness and weakness but resolved.  Left arm numbness has become constant, associated with dizziness, and blurry vision mainly in the mornings for last 2 days.  Complaining about right sided frontal headache, aching like intermittent.  ED Course: CTA shows mild atherosclerosis of the left ICA bulb and both ICA siphons, neurology recommend MRI and transferred to [facility] Course by Problem:   Possible cervical spinal disease, doubt TIA, no Stroke  Has L sided hyperrelexia  CTA head & neck mild L ICA bulb and B ICA siphon atherosclerosis. No acute abnormality  MRI  No acute  abnormality  MRI CSpine done  2D Echo EF 60-65%. No source of embolus   LDL 52  HgbA1c 11.8 Per neurology: recommend ruling out compressive cervical cord lesion by checking MRI scan of the brain with and without contrast to look for demyelinating or compressive lesions  Hyperlipidemia  Continue home meds  Diabetes type II Uncontrolled HgbA1c 11.8, goal < 7.0 Encourage patient to work with PCP on controlling blood sugars   Medical Consultants:    Neurology  Discharge Exam:   Vitals:   08/09/19 1205 08/09/19 1602  BP: (!) 127/95 135/86  Pulse: 84 71  Resp: 17 15  Temp: 98 F (36.7 C) 98.1 F (36.7 C)  SpO2:  98%   Vitals:   08/09/19 0726 08/09/19 0823 08/09/19 1205 08/09/19 1602  BP: 120/78  (!) 127/95 135/86  Pulse: 67  84 71  Resp: 12 15 17 15   Temp: 98 F (36.7 C)  98 F (36.7 C) 98.1 F (36.7 C)  TempSrc: Oral  Oral Oral  SpO2: 98%   98%  Weight:      Height:        General exam: Appears calm and comfortable.    The results of significant diagnostics from this hospitalization (including imaging, microbiology, ancillary and laboratory) are listed below for reference.     Procedures and Diagnostic Studies:   CT Angio Head W or Wo Contrast  Result Date: 08/08/2019 CLINICAL DATA:  54 year old male with dizziness and left arm numbness. Left side deficits for several days. Diabetes. EXAM: CT ANGIOGRAPHY HEAD AND NECK TECHNIQUE: Multidetector CT imaging of the head and neck was performed using the standard  protocol during bolus administration of intravenous contrast. Multiplanar CT image reconstructions and MIPs were obtained to evaluate the vascular anatomy. Carotid stenosis measurements (when applicable) are obtained utilizing NASCET criteria, using the distal internal carotid diameter as the denominator. CONTRAST:  OMNIPAQUE IOHEXOL 350 MG/ML SOLN COMPARISON:  Report of [facility] head CT 10/09/2003 (no  images available). FINDINGS: CT HEAD Brain: No midline shift, ventriculomegaly, mass effect, evidence of mass lesion, intracranial hemorrhage or evidence of cortically based acute infarction. Minimal to mild scattered white matter hypodensity. No cortical encephalomalacia identified. Calvarium and skull base: Negative. Paranasal sinuses: Visualized paranasal sinuses and mastoids are clear. Orbits: Visualized orbits and scalp soft tissues are within normal limits. CTA NECK Skeleton: Congenital incomplete ossification of the posterior C1 ring. No acute osseous abnormality identified. Upper chest: Negative. Other neck: Subcentimeter hypodense thyroid nodules do not meet size criteria for ultrasound follow-up. The glottis is closed. Some of the nasopharynx is also effaced at the soft palate. But no acute neck soft tissue findings are identified. Aortic arch: 3 vessel arch configuration with minimal arch atherosclerosis. Right carotid system: Negative. Left carotid system: Soft plaque in the posterior left ICA bulb but less than 50 % stenosis with respect to the distal vessel. Vertebral arteries: Normal proximal right subclavian artery and right vertebral artery origin. The right vertebral artery is patent to the skull base without plaque or stenosis. Normal proximal left subclavian artery and left vertebral artery origin. The left vertebral appears code dominant and is patent to the skull base without plaque or stenosis. CTA HEAD Posterior circulation: Normal vertebral artery V4 segments. Patent PICA origins. Normal vertebrobasilar junction. Patent basilar artery without stenosis. Normal SCA and PCA origins. Posterior communicating arteries are diminutive or absent. Bilateral PCA branches are within normal limits. Anterior circulation: Left ICA siphon is patent with mild calcified plaque and no stenosis. Normal left ophthalmic artery origin. Right siphon is patent with similar mild cavernous segment calcified plaque, no  stenosis. Normal right ophthalmic artery origin. A small distal right ICA infundibulum is identified on series 13, image 103 (normal variant). Patent carotid termini. Normal MCA and ACA origins. Dominant right ACA A1 segment. Anterior communicating artery and bilateral ACA branches are within normal limits. Left MCA M1 segment and bifurcation are patent without stenosis. Left MCA branches are within normal limits. Right MCA M1 segment and bifurcation are patent without stenosis. Right MCA branches are within normal limits. Venous sinuses: Patent. Anatomic variants: Dominant right ACA A1 segment. Review of the MIP images confirms the above findings IMPRESSION: 1. Mild atherosclerosis of the left ICA bulb and both ICA siphons. But otherwise negative CTA Head and Neck. 2. No acute intracranial abnormality. Negative CT appearance of the brain. Electronically Signed   By: Odessa Fleming M.D.   On: 08/08/2019 11:47   CT Angio Neck W and/or Wo Contrast  Result Date: 08/08/2019 CLINICAL DATA:  54 year old male with dizziness and left arm numbness. Left side deficits for several days. Diabetes. EXAM: CT ANGIOGRAPHY HEAD AND NECK TECHNIQUE: Multidetector CT imaging of the head and neck was performed using the standard protocol during bolus administration of intravenous contrast. Multiplanar CT image reconstructions and MIPs were obtained to evaluate the vascular anatomy. Carotid stenosis measurements (when applicable) are obtained utilizing NASCET criteria, using the distal internal carotid diameter as the denominator. CONTRAST:  OMNIPAQUE IOHEXOL 350 MG/ML SOLN COMPARISON:  Report of [facility] head CT 10/09/2003 (no  images available). FINDINGS: CT HEAD Brain: No midline shift, ventriculomegaly, mass effect, evidence of mass lesion, intracranial hemorrhage or evidence of cortically based acute infarction. Minimal to mild scattered white matter hypodensity. No cortical  encephalomalacia identified. Calvarium and skull base: Negative. Paranasal sinuses: Visualized paranasal sinuses and mastoids are clear. Orbits: Visualized orbits and scalp soft tissues are within normal limits. CTA NECK Skeleton: Congenital incomplete ossification of the posterior C1 ring. No acute osseous abnormality identified. Upper chest: Negative. Other neck: Subcentimeter hypodense thyroid nodules do not meet size criteria for ultrasound follow-up. The glottis is closed. Some of the nasopharynx is also effaced at the soft palate. But no acute neck soft tissue findings are identified. Aortic arch: 3 vessel arch configuration with minimal arch atherosclerosis. Right carotid system: Negative. Left carotid system: Soft plaque in the posterior left ICA bulb but less than 50 % stenosis with respect to the distal vessel. Vertebral arteries: Normal proximal right subclavian artery and right vertebral artery origin. The right vertebral artery is patent to the skull base without plaque or stenosis. Normal proximal left subclavian artery and left vertebral artery origin. The left vertebral appears code dominant and is patent to the skull base without plaque or stenosis. CTA HEAD Posterior circulation: Normal vertebral artery V4 segments. Patent PICA origins. Normal vertebrobasilar junction. Patent basilar artery without stenosis. Normal SCA and PCA origins. Posterior communicating arteries are diminutive or absent. Bilateral PCA branches are within normal limits. Anterior circulation: Left ICA siphon is patent with mild calcified plaque and no stenosis. Normal left ophthalmic artery origin. Right siphon is patent with similar mild cavernous segment calcified plaque, no stenosis. Normal right ophthalmic artery origin. A small distal right ICA infundibulum is identified on series 13, image 103 (normal variant). Patent carotid termini. Normal MCA and ACA origins. Dominant right ACA A1 segment. Anterior communicating artery  and bilateral ACA branches are within normal limits. Left MCA M1 segment and bifurcation are patent without stenosis. Left MCA branches are within normal limits. Right MCA M1 segment and bifurcation are patent without stenosis. Right MCA branches are within normal limits. Venous sinuses: Patent. Anatomic variants: Dominant right ACA A1 segment. Review of the MIP images confirms the above findings IMPRESSION: 1. Mild atherosclerosis of the left ICA bulb and both ICA siphons. But otherwise negative CTA Head and Neck. 2. No acute intracranial abnormality. Negative CT appearance of the brain. Electronically Signed   By: Odessa Fleming M.D.   On: 08/08/2019 11:47   MR BRAIN WO CONTRAST  Result Date: 08/09/2019 CLINICAL DATA:  Dizziness and left arm numbness EXAM: MRI HEAD WITHOUT CONTRAST TECHNIQUE: Multiplanar, multiecho pulse sequences of the brain and surrounding structures were obtained without intravenous contrast. COMPARISON:  None. FINDINGS: Brain: There is no acute infarction or intracranial hemorrhage. There is no intracranial mass, mass effect, or edema. There is no hydrocephalus or extra-axial fluid collection. Minimal patchy T2 hyperintensity in the supratentorial white matter is nonspecific but may reflect minor chronic microvascular ischemic changes. Vascular: Major vessel flow voids at the skull base are preserved. Skull and upper cervical spine: Normal marrow signal is preserved. Sinuses/Orbits: Paranasal sinuses are aerated. Orbits are unremarkable. Other: Sella is unremarkable.  Mastoid air cells are clear. IMPRESSION: No evidence of recent infarction, hemorrhage, or mass. Electronically Signed   By: Guadlupe Spanish M.D.   On: 08/09/2019 10:35   MR CERVICAL SPINE W WO CONTRAST  Result Date: 08/09/2019 CLINICAL DATA:  54 year old male with multiple sclerosis. Hyper reflexia. Left arm numbness. EXAM: MRI  CERVICAL SPINE WITHOUT AND WITH CONTRAST TECHNIQUE: Multiplanar and multiecho pulse sequences of the  cervical spine, to include the craniocervical junction and cervicothoracic junction, were obtained without and with intravenous contrast. CONTRAST:  10mL GADAVIST GADOBUTROL 1 MMOL/ML IV SOLN COMPARISON:  Brain MRI earlier today. CTA head and neck yesterday. FINDINGS: Alignment: Preserved cervical lordosis. No spondylolisthesis. Vertebrae: No marrow edema or evidence of acute osseous abnormality. Visualized bone marrow signal is within normal limits. Cord: The cervical and visible upper thoracic spinal cord appear normal. Fairly capacious spinal canal. No convincing abnormal cord signal. No abnormal intradural enhancement. No dural thickening. Posterior Fossa, vertebral arteries, paraspinal tissues: Cervicomedullary junction is within normal limits. Negative visible posterior fossa. Preserved major vascular flow voids in the neck. Negative neck soft tissues. Disc levels: C2-C3:  Negative. C3-C4: Mild foraminal endplate spurring. Mild bilateral C4 foraminal stenosis. C4-C5: Mild disc bulge and endplate spurring mostly affecting the neural foramina. Mild facet hypertrophy. No spinal stenosis. Moderate to severe bilateral C5 foraminal stenosis. C5-C6: Circumferential disc bulge and endplate spurring. Mild facet hypertrophy. No spinal stenosis. Moderate left and mild-to-moderate right C6 foraminal stenosis. C6-C7:  Negative. C7-T1:  Mild facet hypertrophy greater on the left. No stenosis. Negative visible upper thoracic levels. IMPRESSION: 1. No evidence of cervical spinal cord demyelination. 2. Generally mild for age cervical spine degeneration with no spinal stenosis. But there is moderate to severe bilateral C5 and left C6 neural foraminal stenosis. Electronically Signed   By: Odessa Fleming M.D.   On: 08/09/2019 15:06   ECHOCARDIOGRAM COMPLETE  Result Date: 08/09/2019    ECHOCARDIOGRAM REPORT   Patient Name:   [name] Date of Exam: 08/09/2019 Medical Rec #:  [id number]           Height:       70.0 in Accession  #:    [id number]          Weight:       226.4 lb Date of Birth:  05-Mar-1966          BSA:          2.200 m Patient Age:    53 years            BP:           120/78 mmHg Patient Gender: M                   HR:           67 bpm. Exam Location:  Inpatient Procedure: 2D Echo Indications:    TIA 435.9  History:        Patient has no prior history of Echocardiogram examinations.                 Risk Factors:Diabetes.  Sonographer:    Thurman Coyer RDCS (AE) Referring Phys: [id number] Emeline General IMPRESSIONS  1. Left ventricular ejection fraction, by estimation, is 60 to 65%. The left ventricle has normal function. The left ventricle has no regional wall motion abnormalities. Left ventricular diastolic parameters were normal.  2. Right ventricular systolic function is normal. The right ventricular size is normal.  3. The mitral valve is normal in structure and function. No evidence of mitral valve regurgitation. No evidence of mitral stenosis.  4. The aortic valve is normal in structure and function. Aortic valve regurgitation is not visualized. No aortic stenosis is present.  5. The inferior vena cava is normal in size with greater than 50% respiratory variability, suggesting right  atrial pressure of 3 mmHg. FINDINGS  Left Ventricle: Left ventricular ejection fraction, by estimation, is 60 to 65%. The left ventricle has normal function. The left ventricle has no regional wall motion abnormalities. The left ventricular internal cavity size was normal in size. There is  no left ventricular hypertrophy. Left ventricular diastolic parameters were normal. Right Ventricle: The right ventricular size is normal. No increase in right ventricular wall thickness. Right ventricular systolic function is normal. Left Atrium: Left atrial size was normal in size. Right Atrium: Right atrial size was normal in size. Pericardium: There is no evidence of pericardial effusion. Mitral Valve: The mitral valve is normal in structure and  function. Normal mobility of the mitral valve leaflets. No evidence of mitral valve regurgitation. No evidence of mitral valve stenosis. Tricuspid Valve: The tricuspid valve is normal in structure. Tricuspid valve regurgitation is not demonstrated. No evidence of tricuspid stenosis. Aortic Valve: The aortic valve is normal in structure and function. Aortic valve regurgitation is not visualized. No aortic stenosis is present. Pulmonic Valve: The pulmonic valve was normal in structure. Pulmonic valve regurgitation is not visualized. No evidence of pulmonic stenosis. Aorta: The aortic root is normal in size and structure. Venous: The inferior vena cava is normal in size with greater than 50% respiratory variability, suggesting right atrial pressure of 3 mmHg. IAS/Shunts: No atrial level shunt detected by color flow Doppler.  LEFT VENTRICLE PLAX 2D LVIDd:         4.10 cm  Diastology LVIDs:         3.20 cm  LV e' lateral:   9.36 cm/s LV PW:         1.00 cm  LV E/e' lateral: 8.3 LV IVS:        1.10 cm  LV e' medial:    7.07 cm/s LVOT diam:     2.10 cm  LV E/e' medial:  11.0 LV SV:         63 LV SV Index:   28 LVOT Area:     3.46 cm  RIGHT VENTRICLE RV S prime:     12.50 cm/s TAPSE (M-mode): 1.9 cm LEFT ATRIUM             Index       RIGHT ATRIUM           Index LA diam:        2.90 cm 1.32 cm/m  RA Area:     13.90 cm LA Vol (A2C):   51.2 ml 23.27 ml/m RA Volume:   30.20 ml  13.73 ml/m LA Vol (A4C):   56.7 ml 25.77 ml/m LA Biplane Vol: 55.0 ml 25.00 ml/m  AORTIC VALVE LVOT Vmax:   78.30 cm/s LVOT Vmean:  50.100 cm/s LVOT VTI:    0.181 m  AORTA Ao Root diam: 3.10 cm MITRAL VALVE MV Area (PHT): 5.38 cm    SHUNTS MV Decel Time: 141 msec    Systemic VTI:  0.18 m MV E velocity: 78.10 cm/s  Systemic Diam: 2.10 cm MV A velocity: 73.20 cm/s MV E/A ratio:  1.07 Mihai Croitoru MD Electronically signed by Thurmon Fair MD Signature Date/Time: 08/09/2019/2:38:41 PM    Final      Labs:   Basic Metabolic Panel: Recent Labs   Lab 08/08/19 1010  NA 135  K 3.9  CL 102  CO2 25  GLUCOSE 309*  BUN 13  CREATININE 1.00  CALCIUM 9.3   GFR Estimated Creatinine Clearance: 100.2 mL/min (by C-G formula  based on SCr of 1 mg/dL). Liver Function Tests: Recent Labs  Lab 08/08/19 1010  AST 36  ALT 38  ALKPHOS 77  BILITOT 1.1  PROT 6.9  ALBUMIN 4.3   No results for input(s): LIPASE, AMYLASE in the last 168 hours. No results for input(s): AMMONIA in the last 168 hours. Coagulation profile Recent Labs  Lab 08/08/19 1010  INR 0.9    CBC: Recent Labs  Lab 08/08/19 1010  WBC 8.2  NEUTROABS 4.6  HGB 16.2  HCT 48.9  MCV 90.6  PLT 202   Cardiac Enzymes: No results for input(s): CKTOTAL, CKMB, CKMBINDEX, TROPONINI in the last 168 hours. BNP: Invalid input(s): POCBNP CBG: Recent Labs  Lab 08/08/19 1734 08/08/19 2158 08/09/19 0638 08/09/19 1154 08/09/19 1722  GLUCAP 308* 295* 292* 348* 264*   D-Dimer No results for input(s): DDIMER in the last 72 hours. Hgb A1c Recent Labs    08/08/19 1711  HGBA1C 11.8*   Lipid Profile Recent Labs    08/09/19 0258  CHOL 98  HDL 25*  LDLCALC 52  TRIG 811  CHOLHDL 3.9   Thyroid function studies No results for input(s): TSH, T4TOTAL, T3FREE, THYROIDAB in the last 72 hours.  Invalid input(s): FREET3 Anemia work up No results for input(s): VITAMINB12, FOLATE, FERRITIN, TIBC, IRON, RETICCTPCT in the last 72 hours. Microbiology Recent Results (from the past 240 hour(s))  SARS CORONAVIRUS 2 (TAT 6-24 HRS) Nasopharyngeal Nasopharyngeal Swab     Status: None   Collection Time: 08/08/19 12:31 PM   Specimen: Nasopharyngeal Swab  Result Value Ref Range Status   SARS Coronavirus 2 NEGATIVE NEGATIVE Final    Comment: (NOTE) SARS-CoV-2 target nucleic acids are NOT DETECTED. The SARS-CoV-2 RNA is generally detectable in upper and lower respiratory specimens during the acute phase of infection. Negative results do not preclude SARS-CoV-2 infection, do not  rule out co-infections with other pathogens, and should not be used as the sole basis for treatment or other patient management decisions. Negative results must be combined with clinical observations, patient history, and epidemiological information. The expected result is Negative. Fact Sheet for Patients: HairSlick.no Fact Sheet for Healthcare Providers: quierodirigir.com This test is not yet approved or cleared by the [place] FDA and  has been authorized for detection and/or diagnosis of SARS-CoV-2 by FDA under an Emergency Use Authorization (EUA). This EUA will remain  in effect (meaning this test can be used) for the duration of the COVID-19 declaration under Section 56 4(b)(1) of the Act, 21 U.S.C. section 360bbb-3(b)(1), unless the authorization is terminated or revoked sooner. Performed at [facility], [address], [place], [place] [address]      Discharge Instructions:   Discharge Instructions    Ambulatory referral to Neurology   Complete by: As directed    An appointment is requested in approximately: 1-2 weeks for EMG/nerve conduction study   Diet Carb Modified   Complete by: As directed    Discharge instructions   Complete by: As directed    Will place outpatient referral to neurology for EMG/nerve conduction study Resume your insulin pump tommorrow   Increase activity slowly   Complete by: As directed      Allergies as of 08/09/2019   No Known Allergies     Medication List    STOP taking these medications   benzonatate 100 MG capsule Commonly known as: TESSALON     TAKE these medications   albuterol 108 (90 Base) MCG/ACT inhaler Commonly known as: VENTOLIN  HFA Inhale 2 puffs into the lungs every 6 (six) hours as needed. What changed: reasons to take this   AndroGel 20.25 MG/1.25GM (1.62%) Gel Generic drug: Testosterone Apply 1 application topically daily. Apply to upper body     aspirin EC 81 MG tablet Take 81 mg by mouth daily. Reported on 07/04/2015   atorvastatin 10 MG tablet Commonly known as: LIPITOR Take 1 tablet (10 mg total) by mouth daily.   budesonide-formoterol 80-4.5 MCG/ACT inhaler Commonly known as: SYMBICORT Inhale 2 puffs into the lungs 2 (two) times daily.   HumaLOG 100 UNIT/ML injection Generic drug: insulin lispro Inject 10-13 Units into the skin See admin instructions. Per pump and depends on sugar level   metFORMIN 500 MG tablet Commonly known as: GLUCOPHAGE Take 500 mg by mouth daily.   OmniPod Dash 5 Pack Pods Misc   sildenafil 100 MG tablet Commonly known as: VIAGRA Take 1 tablet (100 mg total) by mouth daily as needed for erectile dysfunction. Max one tab in any 24 hour period.   True Metrix Blood Glucose Test test strip Generic drug: glucose blood 1 each by Other route 2 (two) times daily.   varenicline 0.5 MG X 11 & 1 MG X 42 tablet Commonly known as: CHANTIX PAK Take one 0.5 mg tablet by mouth once daily for 3 days, then increase to one 0.5 mg tablet twice daily for 4 days, then increase to one 1 mg tablet twice daily.      Follow-up Information    Saguier, Percell Miller, PA-C Follow up in 1 week(s).   Specialties: Internal Medicine, Family Medicine Contact information: [place] [address] [phone]        [facility] Follow up.   Specialty: Neurology Why: referral has been placed for EMG/nerve conduction study Contact information: [place] [phone]           Time coordinating discharge: 25 minutes  Signed:  Geradine Girt DO  [facility] 08/09/2019, 5:31 PM

## 2019-08-10 ENCOUNTER — Telehealth: Payer: Self-pay | Admitting: *Deleted

## 2019-08-10 NOTE — Telephone Encounter (Signed)
Transition Care Management Follow-up Telephone Call    Date discharged? 08/09/19   How have you been since you were released from the hospital? "Doing well"   Do you understand why you were in the hospital? yes   Do you understand the discharge instructions? yes   Where were you discharged to? Home   Items Reviewed:  Medications reviewed: "they didn't make any changes"  Allergies reviewed: no  Dietary changes reviewed: no  Referrals reviewed: no   Functional Questionnaire:   Activities of Daily Living (ADLs):   He states they are independent in the following: ambulation, bathing and hygiene, feeding, continence, grooming, toileting and dressing States they require assistance with the following: na   Any transportation issues/concerns?: no   Any patient concerns? no   Pt states he will have to check his work schedule and will call back when he needs to see PCP.   Confirmed with patient if condition begins to worsen call PCP or go to the ER.  Patient was given the office number and encouraged to call back with question or concerns.  : yes

## 2019-08-11 ENCOUNTER — Other Ambulatory Visit: Payer: Self-pay | Admitting: *Deleted

## 2019-08-11 NOTE — Patient Outreach (Signed)
[facility]) Care Management  08/11/2019  [name] 17-Dec-1965 [id number]   Transition of care call  Referral received:08/11/19 Initial outreach:08/11/19 Insurance: [place] [employer]    Subjective: Initial successful telephone call to patient's preferred number in order to complete transition of care assessment; 2 HIPAA identifiers verified. Explained purpose of call . [name] requested return call on 08/12/19 0830.   Objective:  [name]  was hospitalized at [facility] from 3/1-3/2 /2021 for Hyperflexia,left arm numbness, parathesia Comorbidities include: Type 2 diabetes using Omnipod, hypertension.  He was discharged to home on 08/09/19 without the need for home health services or DME.  Plan  Will plan return call on the next day as requested.   Egbert Garibaldi, RN, BSN  [facility] Care Management,Care Management Coordinator  [phone]- Mobile [phone]- Toll Free Main Office

## 2019-08-12 ENCOUNTER — Encounter: Payer: Self-pay | Admitting: *Deleted

## 2019-08-12 ENCOUNTER — Other Ambulatory Visit: Payer: Self-pay | Admitting: *Deleted

## 2019-08-12 NOTE — Patient Outreach (Signed)
[facility]) Care Management  08/12/2019  [name] October 03, 1965 [id number]   Transition of care call  Referral received:08/11/19 Initial outreach:08/11/19 Insurance: [place] [employer]    Subjective:  Successful return  telephone call to patient's preferred number in order to complete transition of care assessment; 2 HIPAA identifiers verified. Explained purpose of call and completed transition of care assessment.  [name] states that he feels a lot better, he states that still has numbness in left arm no worsening. He discussed plan for further testing with neurology he discussed possibly of having some nerve cervical spine disease as cause, , denies stroke diagnosis, understands [facility]FAST and seeking medical attention. Marland KitchenHe report  tolerating diet, denies bowel or bladder problems.  Spouse  assisting with his recovery.  He discussed chronic conditions of diabetes, hypertension, he discussed being active with Active health management for chronic condition program and receiving a call on yesterday. He discussed going to nutrition class in the past, and will speak with endocrinologist on next week regarding referral to nutrition class.  He discussed having Omnipod restarted yesterday, reports blood sugar at 226 this morning has follow up endocrinologist on 3/8, encouraged notifying MD of continued elevation in 200 range. Discussed recent A1c of 11.8% and diabetes control goal of 7.Reinforced monitoring of blood sugars.  Patient reports that he believes that he does have  hospital indemnity plan, provided number to [facility] and explained he will need to file the claim. He does use a [employer], [facility] [employer]. Discussed checking with pharmacy regarding blood pressure monitor availability for employee cost of $10 if in stock at pharmacy.   Objective: IsaacMendoza was hospitalized [facility] from 3/1-3/2 /2021 for Hyperflexia,left arm numbness,  parathesia Comorbidities include: Type 2 diabetes using Omnipod, A1c 11.8,  hypertension.  He was discharged to home on 08/09/19 without the need for home health servicesor DME.   Assessment:  Patient voices good understanding of all discharge instructions.  See transition of care flowsheet for assessment details.   Plan:  Reviewed hospital discharge diagnosis of Hyperflexia, left arm numbness   and discharge treatment plan using hospital discharge instructions, assessing medication adherence, reviewing problems requiring provider notification, and discussing the importance of follow up with  primary care provider and/or specialists as directed. Reviewed [place] healthy lifestyle program information to receive discounted premium for  2022  Step 1: Get annual physical between June 09, 2018 and December 08, 2019; Step 2: Complete your health assessment between June 10, 2019 and February 08, 2020 at [email or web address] Step 3:Identify your current health status and complete the corresponding action step between January 1, and February 08, 2020.   Using Active Health Management [employer] website, verified that patient is an active participate in [name]'s Active Health Management chronic disease management program. Will send patient EMMI handout on Diabetes Diet and Hemoglobin A1c.      Will plan follow up call in the next week regarding arranging PCP post discharge visit.  Will  route successful outreach letter with [facility] Care Management pamphlet and 24 Hour Nurse Line Magnet to [name] Care Management clinical pool to be mailed to patient's home address.  Thanked patient for their services to [name].  Isaac Garibaldi, RN, BSN  [facility] Care Management,Care Management Coordinator  [phone]- Mobile [phone]- Toll Free Main Office

## 2019-08-16 DIAGNOSIS — E559 Vitamin D deficiency, unspecified: Secondary | ICD-10-CM | POA: Diagnosis not present

## 2019-08-16 DIAGNOSIS — E1165 Type 2 diabetes mellitus with hyperglycemia: Secondary | ICD-10-CM | POA: Diagnosis not present

## 2019-08-16 DIAGNOSIS — F17200 Nicotine dependence, unspecified, uncomplicated: Secondary | ICD-10-CM | POA: Diagnosis not present

## 2019-08-16 DIAGNOSIS — E291 Testicular hypofunction: Secondary | ICD-10-CM | POA: Diagnosis not present

## 2019-08-17 ENCOUNTER — Other Ambulatory Visit: Payer: Self-pay | Admitting: *Deleted

## 2019-08-17 NOTE — Patient Outreach (Addendum)
[facility]) Care Management  08/17/2019  Elizer Bostic Zeiter 08-16-1965 [id number]   Transition of care follow up call   Referral received:08/11/19 Initial outreach:08/11/19 Insurance: [place] [employer]    Subjective: Successful outreach call to patient, HIPAA confirmed x 2 identifiers. Explained reason for follow up. Patient requested return call later in day.   1615 Return call to patient, explained follow up from previous call. Patient discussed seeing his endocrinologist on yesterday, adjustments in progress for omnipod dosages office to have Omnipod representative to contact patient, blood sugar ranges in 200's.  Patient states his is also in the process of getting a Dexcom he is looking forward to that to help with knowing blood sugar throughout the day to help with goal of decreasing his A1c.  Patient to discuss with endocrinology office referral for nutrition class discussed previously. Offered patient additional education resource on diet or diabetes management he declines at this time. Discussed insurance benefits includes covering referral to nutrition classes and reinforced benefits of attending . Expressed appreciation of follow up call.  Reinforced contacting PCP for follow up visit , he is agreeable to. Encouraged continued follow up with active health management health coach.   Plan No further care management needs at this time will plan case closure.    Egbert Garibaldi, RN, BSN  [facility] Care Management,Care Management Coordinator  [phone]- Mobile [phone]- Toll Free Main Office

## 2019-09-05 MED FILL — HumaLOG 100 UNIT/ML SOLN: 100 | 90 days supply | Qty: 90 | Fill #1

## 2019-09-05 MED FILL — PENTIPS 31G X 5 MM MISC: 31G X 5 MM | 90 days supply | Qty: 100 | Fill #1

## 2019-09-06 ENCOUNTER — Other Ambulatory Visit (HOSPITAL_COMMUNITY): Payer: Self-pay | Admitting: Internal Medicine

## 2019-09-06 MED FILL — FREESTYLE LANCETS: 90 days supply | Qty: 300 | Fill #0

## 2019-09-06 MED FILL — FREESTYLE LITE METER: 30 days supply | Qty: 1 | Fill #0

## 2019-09-06 MED FILL — FREESTYLE LITE TEST STRIP: 90 days supply | Qty: 300 | Fill #0

## 2019-09-07 ENCOUNTER — Encounter: Payer: 59 | Admitting: Neurology

## 2019-09-20 DIAGNOSIS — E1165 Type 2 diabetes mellitus with hyperglycemia: Secondary | ICD-10-CM | POA: Diagnosis not present

## 2019-09-20 DIAGNOSIS — E119 Type 2 diabetes mellitus without complications: Secondary | ICD-10-CM | POA: Diagnosis not present

## 2019-09-20 DIAGNOSIS — Z794 Long term (current) use of insulin: Secondary | ICD-10-CM | POA: Diagnosis not present

## 2019-10-04 ENCOUNTER — Encounter: Payer: 59 | Admitting: Neurology

## 2019-10-11 DIAGNOSIS — F17200 Nicotine dependence, unspecified, uncomplicated: Secondary | ICD-10-CM | POA: Diagnosis not present

## 2019-10-11 DIAGNOSIS — E559 Vitamin D deficiency, unspecified: Secondary | ICD-10-CM | POA: Diagnosis not present

## 2019-10-11 DIAGNOSIS — E1165 Type 2 diabetes mellitus with hyperglycemia: Secondary | ICD-10-CM | POA: Diagnosis not present

## 2019-10-11 DIAGNOSIS — E291 Testicular hypofunction: Secondary | ICD-10-CM | POA: Diagnosis not present

## 2019-10-19 ENCOUNTER — Other Ambulatory Visit (HOSPITAL_COMMUNITY): Payer: Self-pay | Admitting: Internal Medicine

## 2019-10-20 DIAGNOSIS — Z794 Long term (current) use of insulin: Secondary | ICD-10-CM | POA: Diagnosis not present

## 2019-10-20 DIAGNOSIS — E1165 Type 2 diabetes mellitus with hyperglycemia: Secondary | ICD-10-CM | POA: Diagnosis not present

## 2019-10-20 DIAGNOSIS — E119 Type 2 diabetes mellitus without complications: Secondary | ICD-10-CM | POA: Diagnosis not present

## 2019-10-27 MED FILL — HUMALOG MIX 75-25 KWIKPEN: (75-25) 100 | 87 days supply | Qty: 45 | Fill #0

## 2019-10-27 MED FILL — SILDENAFIL CITRATE 100 MG T: 100 | 10 days supply | Qty: 10 | Fill #0

## 2019-11-09 DIAGNOSIS — E1165 Type 2 diabetes mellitus with hyperglycemia: Secondary | ICD-10-CM | POA: Diagnosis not present

## 2019-11-20 DIAGNOSIS — Z794 Long term (current) use of insulin: Secondary | ICD-10-CM | POA: Diagnosis not present

## 2019-11-20 DIAGNOSIS — E1165 Type 2 diabetes mellitus with hyperglycemia: Secondary | ICD-10-CM | POA: Diagnosis not present

## 2019-11-20 DIAGNOSIS — E119 Type 2 diabetes mellitus without complications: Secondary | ICD-10-CM | POA: Diagnosis not present

## 2019-12-13 DIAGNOSIS — E1165 Type 2 diabetes mellitus with hyperglycemia: Secondary | ICD-10-CM | POA: Diagnosis not present

## 2019-12-13 DIAGNOSIS — E559 Vitamin D deficiency, unspecified: Secondary | ICD-10-CM | POA: Diagnosis not present

## 2019-12-13 DIAGNOSIS — F17200 Nicotine dependence, unspecified, uncomplicated: Secondary | ICD-10-CM | POA: Diagnosis not present

## 2019-12-13 DIAGNOSIS — E291 Testicular hypofunction: Secondary | ICD-10-CM | POA: Diagnosis not present

## 2019-12-13 MED FILL — metFORMIN HCL ER 500 MG TB2: 500 | 90 days supply | Qty: 180 | Fill #0

## 2019-12-20 DIAGNOSIS — Z794 Long term (current) use of insulin: Secondary | ICD-10-CM | POA: Diagnosis not present

## 2019-12-20 DIAGNOSIS — E119 Type 2 diabetes mellitus without complications: Secondary | ICD-10-CM | POA: Diagnosis not present

## 2019-12-20 DIAGNOSIS — E1165 Type 2 diabetes mellitus with hyperglycemia: Secondary | ICD-10-CM | POA: Diagnosis not present

## 2020-01-18 DIAGNOSIS — E1165 Type 2 diabetes mellitus with hyperglycemia: Secondary | ICD-10-CM | POA: Diagnosis not present

## 2020-01-20 DIAGNOSIS — E1165 Type 2 diabetes mellitus with hyperglycemia: Secondary | ICD-10-CM | POA: Diagnosis not present

## 2020-01-20 DIAGNOSIS — Z794 Long term (current) use of insulin: Secondary | ICD-10-CM | POA: Diagnosis not present

## 2020-01-20 DIAGNOSIS — E119 Type 2 diabetes mellitus without complications: Secondary | ICD-10-CM | POA: Diagnosis not present

## 2020-02-20 DIAGNOSIS — E1165 Type 2 diabetes mellitus with hyperglycemia: Secondary | ICD-10-CM | POA: Diagnosis not present

## 2020-02-20 DIAGNOSIS — Z794 Long term (current) use of insulin: Secondary | ICD-10-CM | POA: Diagnosis not present

## 2020-02-20 DIAGNOSIS — E119 Type 2 diabetes mellitus without complications: Secondary | ICD-10-CM | POA: Diagnosis not present

## 2020-04-10 DIAGNOSIS — E1165 Type 2 diabetes mellitus with hyperglycemia: Secondary | ICD-10-CM | POA: Diagnosis not present

## 2020-04-10 DIAGNOSIS — E291 Testicular hypofunction: Secondary | ICD-10-CM | POA: Diagnosis not present

## 2020-04-10 DIAGNOSIS — E559 Vitamin D deficiency, unspecified: Secondary | ICD-10-CM | POA: Diagnosis not present

## 2020-04-10 DIAGNOSIS — F17200 Nicotine dependence, unspecified, uncomplicated: Secondary | ICD-10-CM | POA: Diagnosis not present

## 2020-04-16 DIAGNOSIS — E1165 Type 2 diabetes mellitus with hyperglycemia: Secondary | ICD-10-CM | POA: Diagnosis not present

## 2020-07-12 DIAGNOSIS — E1165 Type 2 diabetes mellitus with hyperglycemia: Secondary | ICD-10-CM | POA: Diagnosis not present

## 2020-07-17 ENCOUNTER — Telehealth: Payer: Self-pay | Admitting: Acute Care

## 2020-07-17 DIAGNOSIS — E1165 Type 2 diabetes mellitus with hyperglycemia: Secondary | ICD-10-CM | POA: Diagnosis not present

## 2020-07-17 DIAGNOSIS — F17210 Nicotine dependence, cigarettes, uncomplicated: Secondary | ICD-10-CM

## 2020-07-17 DIAGNOSIS — B351 Tinea unguium: Secondary | ICD-10-CM | POA: Diagnosis not present

## 2020-07-17 DIAGNOSIS — E291 Testicular hypofunction: Secondary | ICD-10-CM | POA: Diagnosis not present

## 2020-07-17 DIAGNOSIS — F17200 Nicotine dependence, unspecified, uncomplicated: Secondary | ICD-10-CM | POA: Diagnosis not present

## 2020-07-17 DIAGNOSIS — Z87891 Personal history of nicotine dependence: Secondary | ICD-10-CM

## 2020-07-17 DIAGNOSIS — E559 Vitamin D deficiency, unspecified: Secondary | ICD-10-CM | POA: Diagnosis not present

## 2020-07-18 NOTE — Telephone Encounter (Signed)
LMTC x 1  

## 2020-07-23 NOTE — Telephone Encounter (Signed)
Spoke with pt and scheduled SDMV 08/17/20 2:00 CT ordered and will be scheduled

## 2020-08-17 ENCOUNTER — Encounter: Payer: Self-pay | Admitting: Primary Care

## 2020-08-17 ENCOUNTER — Ambulatory Visit (INDEPENDENT_AMBULATORY_CARE_PROVIDER_SITE_OTHER): Payer: 59

## 2020-08-17 ENCOUNTER — Encounter: Payer: 59 | Admitting: Primary Care

## 2020-08-17 ENCOUNTER — Other Ambulatory Visit: Payer: Self-pay

## 2020-08-17 ENCOUNTER — Ambulatory Visit (INDEPENDENT_AMBULATORY_CARE_PROVIDER_SITE_OTHER): Payer: 59 | Admitting: Primary Care

## 2020-08-17 DIAGNOSIS — F17200 Nicotine dependence, unspecified, uncomplicated: Secondary | ICD-10-CM

## 2020-08-17 DIAGNOSIS — Z87891 Personal history of nicotine dependence: Secondary | ICD-10-CM

## 2020-08-17 DIAGNOSIS — F17210 Nicotine dependence, cigarettes, uncomplicated: Secondary | ICD-10-CM

## 2020-08-17 NOTE — Progress Notes (Signed)
Virtual Visit via Telephone Note  I connected with [name] on 08/17/20 at 11:00 AM EST by telephone and verified that I am speaking with the correct person using two identifiers.  Location: Patient: Home Provider: Office   I discussed the limitations, risks, security and privacy concerns of performing an evaluation and management service by telephone and the availability of in person appointments. I also discussed with the patient that there may be a patient responsible charge related to this service. The patient expressed understanding and agreed to proceed.  Shared Decision Making Visit Lung Cancer Screening Program [phone])   Eligibility:  Age 55 y.o.  Pack Years Smoking History Calculation 30 (# packs/per year x # years smoked)  Recent History of coughing up blood  NO  Unexplained weight loss? NO ( >Than 15 pounds within the last 6 months )  Prior History Lung / other cancer NO (Diagnosis within the last 5 years already requiring surveillance chest CT Scans).  Smoking Status Current Smoker  Former Smokers: Years since quit: < 1 year  Quit Date: NA  Visit Components:  Discussion included one or more decision making aids. yes  Discussion included risk/benefits of screening. yes  Discussion included potential follow up diagnostic testing for abnormal scans. yes  Discussion included meaning and risk of over diagnosis. yes  Discussion included meaning and risk of False Positives. yes  Discussion included meaning of total radiation exposure. yes  Counseling Included:  Importance of adherence to annual lung cancer LDCT screening. yes  Impact of comorbidities on ability to participate in the program. yes  Ability and willingness to under diagnostic treatment. yes  Smoking Cessation Counseling:  Current Smokers:   Discussed importance of smoking cessation. yes  Information about tobacco cessation classes and interventions provided to patient.  yes  Patient provided with "ticket" for LDCT Scan. yes  Symptomatic Patient. no  Counseling(Intermediate counseling: > three minutes) 99406  Diagnosis Code: Tobacco Use Z72.0  Asymptomatic Patient yes  Counseling (Intermediate counseling: > three minutes counseling) N1700  Former Smokers:   Discussed the importance of maintaining cigarette abstinence. yes  Diagnosis Code: Personal History of Nicotine Dependence. F74.944  Information about tobacco cessation classes and interventions provided to patient. Yes  Patient provided with "ticket" for LDCT Scan. yes  Written Order for Lung Cancer Screening with LDCT placed in Epic. Yes (CT Chest Lung Cancer Screening Low Dose W/O CM) [id number] Z12.2-Screening of respiratory organs Z87.891-Personal history of nicotine dependence  I have spent 25 minutes time with Mr Schexnayder discussing the risks and benefits of lung cancer screening. We viewed a power point together that explained in detail the above noted topics. We paused at intervals to allow for questions to be asked and answered to ensure understanding.We discussed that the single most powerful action that he can take to decrease his risk of developing lung cancer is to quit smoking. We discussed whether or not he is ready to commit to setting a quit date. We discussed options for tools to aid in quitting smoking including nicotine replacement therapy, non-nicotine medications, support groups, Quit Smart classes, and behavior modification. We discussed that often times setting smaller, more achievable goals, such as eliminating 1 cigarette a day for a week and then 2 cigarettes a day for a week can be helpful in slowly decreasing the number of cigarettes smoked. This allows for a sense of accomplishment as well as providing a clinical benefit. I provided him with smoking cessation education on Mychart. I provided him  with contact information in the event he needs to contact me. We discussed the  time and location of the scan, and that either Abigail Miyamoto RN or I will call with the results within 24-48 hours of receiving them. I have offered him a copy of the power point we viewed  as a resource in the event they need reinforcement of the concepts we discussed today in the office. The patient verbalized understanding of all of  the above and had no further questions upon leaving the office. They have my contact information in the event they have any further questions.  I spent 5-7 minutes counseling on smoking cessation and the health risks of continued tobacco abuse.  I explained to the patient that there has been a high incidence of coronary artery disease noted on these exams. I explained that this is a non-gated exam therefore degree or severity cannot be determined. This patient is on statin therapy. I have asked the patient to follow-up with their PCP regarding any incidental finding of coronary artery disease and management with diet or medication as their PCP  feels is clinically indicated. The patient verbalized understanding of the above and had no further questions upon completion of the visit.    Glenford Bayley, NP

## 2020-08-17 NOTE — Patient Instructions (Signed)
Thank you for participating in the [facility]. It was our pleasure to meet you today. We will call you with the results of your scan within the next few days. Your scan will be assigned a Lung RADS category score by the physicians reading the scans.  This Lung RADS score determines follow up scanning.  See below for description of categories, and follow up screening recommendations. We will be in touch to schedule your follow up screening annually or based on recommendations of our providers. We will fax a copy of your scan results to your Primary Care Physician, or the physician who referred you to the program, to ensure they have the results. Please call the office if you have any questions or concerns regarding your scanning experience or results.  Our office number is [phone]. Please speak with Abigail Miyamoto, RN. She is our Lung Cancer Data processing manager. If she is unavailable when you call, please have the office staff send her a message. She will return your call at her earliest convenience. Remember, if your scan is normal, we will scan you annually as long as you continue to meet the criteria for the program. (Age 18-77, Current smoker or smoker who has quit within the last 15 years). If you are a smoker, remember, quitting is the single most powerful action that you can take to decrease your risk of lung cancer and other pulmonary, breathing related problems. We know quitting is hard, and we are here to help.  Please let us know if there is anything we can do to help you meet your goal of quitting. If you are a former smoker, Counselling psychologist. We are proud of you! Remain smoke free! Remember you can refer friends or family members through the number above.  We will screen them to make sure they meet criteria for the program. Thank you for helping [place] take better care of you by participating in Lung Screening.  Lung RADS Categories:  Lung RADS 1: no nodules  or definitely non-concerning nodules.  Recommendation is for a repeat annual scan in 12 months.  Lung RADS 2:  nodules that are non-concerning in appearance and behavior with a very low likelihood of becoming an active cancer. Recommendation is for a repeat annual scan in 12 months.  Lung RADS 3: nodules that are probably non-concerning , includes nodules with a low likelihood of becoming an active cancer.  Recommendation is for a 40-month repeat screening scan. Often noted after an upper respiratory illness. We will be in touch to make sure you have no questions, and to schedule your 68-month scan.  Lung RADS 4 A: nodules with concerning findings, recommendation is most often for a follow up scan in 3 months or additional testing based on our provider's assessment of the scan. We will be in touch to make sure you have no questions and to schedule the recommended 3 month follow up scan.  Lung RADS 4 B:  indicates findings that are concerning. We will be in touch with you to schedule additional diagnostic testing based on our provider's  assessment of the scan.   Steps to Quit Smoking Smoking tobacco is the leading cause of preventable death. It can affect almost every organ in the body. Smoking puts you and people around you at risk for many serious, long-lasting (chronic) diseases. Quitting smoking can be hard, but it is one of the best things that you can do for your health. It is never too late to  quit. How do I get ready to quit? When you decide to quit smoking, make a plan to help you succeed. Before you quit:  Pick a date to quit. Set a date within the next 2 weeks to give you time to prepare.  Write down the reasons why you are quitting. Keep this list in places where you will see it often.  Tell your family, friends, and co-workers that you are quitting. Their support is important.  Talk with your doctor about the choices that may help you quit.  Find out if your health insurance will  pay for these treatments.  Know the people, places, things, and activities that make you want to smoke (triggers). Avoid them. What first steps can I take to quit smoking?  Throw away all cigarettes at home, at work, and in your car.  Throw away the things that you use when you smoke, such as ashtrays and lighters.  Clean your car. Make sure to empty the ashtray.  Clean your home, including curtains and carpets. What can I do to help me quit smoking? Talk with your doctor about taking medicines and seeing a counselor at the same time. You are more likely to succeed when you do both.  If you are pregnant or breastfeeding, talk with your doctor about counseling or other ways to quit smoking. Do not take medicine to help you quit smoking unless your doctor tells you to do so. To quit smoking: Quit right away  Quit smoking totally, instead of slowly cutting back on how much you smoke over a period of time.  Go to counseling. You are more likely to quit if you go to counseling sessions regularly. Take medicine You may take medicines to help you quit. Some medicines need a prescription, and some you can buy over-the-counter. Some medicines may contain a drug called nicotine to replace the nicotine in cigarettes. Medicines may:  Help you to stop having the desire to smoke (cravings).  Help to stop the problems that come when you stop smoking (withdrawal symptoms). Your doctor may ask you to use:  Nicotine patches, gum, or lozenges.  Nicotine inhalers or sprays.  Non-nicotine medicine that is taken by mouth. Find resources Find resources and other ways to help you quit smoking and remain smoke-free after you quit. These resources are most helpful when you use them often. They include:  Online chats with a Veterinary surgeon.  Phone quitlines.  Printed Materials engineer.  Support groups or group counseling.  Text messaging programs.  Mobile phone apps. Use apps on your mobile phone or  tablet that can help you stick to your quit plan. There are many free apps for mobile phones and tablets as well as websites. Examples include Quit Guide from the [employer] and smokefree.gov   What things can I do to make it easier to quit?  Talk to your family and friends. Ask them to support and encourage you.  Call a phone quitline (1-800-QUIT-NOW), reach out to support groups, or work with a Veterinary surgeon.  Ask people who smoke to not smoke around you.  Avoid places that make you want to smoke, such as: ? Bars. ? Parties. ? Smoke-break areas at work.  Spend time with people who do not smoke.  Lower the stress in your life. Stress can make you want to smoke. Try these things to help your stress: ? Getting regular exercise. ? Doing deep-breathing exercises. ? Doing yoga. ? Meditating. ? Doing a body scan. To do this,  close your eyes, focus on one area of your body at a time from head to toe. Notice which parts of your body are tense. Try to relax the muscles in those areas.   How will I feel when I quit smoking? Day 1 to 3 weeks Within the first 24 hours, you may start to have some problems that come from quitting tobacco. These problems are very bad 2-3 days after you quit, but they do not often last for more than 2-3 weeks. You may get these symptoms:  Mood swings.  Feeling restless, nervous, angry, or annoyed.  Trouble concentrating.  Dizziness.  Strong desire for high-sugar foods and nicotine.  Weight gain.  Trouble pooping (constipation).  Feeling like you may vomit (nausea).  Coughing or a sore throat.  Changes in how the medicines that you take for other issues work in your body.  Depression.  Trouble sleeping (insomnia). Week 3 and afterward After the first 2-3 weeks of quitting, you may start to notice more positive results, such as:  Better sense of smell and taste.  Less coughing and sore throat.  Slower heart rate.  Lower blood pressure.  Clearer  skin.  Better breathing.  Fewer sick days. Quitting smoking can be hard. Do not give up if you fail the first time. Some people need to try a few times before they succeed. Do your best to stick to your quit plan, and talk with your doctor if you have any questions or concerns. Summary  Smoking tobacco is the leading cause of preventable death. Quitting smoking can be hard, but it is one of the best things that you can do for your health.  When you decide to quit smoking, make a plan to help you succeed.  Quit smoking right away, not slowly over a period of time.  When you start quitting, seek help from your doctor, family, or friends. This information is not intended to replace advice given to you by your health care provider. Make sure you discuss any questions you have with your health care provider. Document Revised: 02/18/2019 Document Reviewed: 08/14/2018 Elsevier Patient Education  2021 Elsevier Inc.   Bupropion sustained-release tablets (smoking cessation) What is this medicine? BUPROPION (byoo PROE pee on) is used to help people quit smoking. This medicine may be used for other purposes; ask your health care provider or pharmacist if you have questions. COMMON BRAND NAME(S): Buproban, Zyban What should I tell my health care provider before I take this medicine? They need to know if you have any of these conditions:  an eating disorder, such as anorexia or bulimia  bipolar disorder or psychosis  diabetes or high blood sugar, treated with medication  glaucoma  head injury or brain tumor  heart disease, previous heart attack, or irregular heart beat  high blood pressure  kidney or liver disease  seizures  suicidal thoughts or a previous suicide attempt  Tourette's syndrome  weight loss  an unusual or allergic reaction to bupropion, other medicines, foods, dyes, or preservatives  breast-feeding  pregnant or trying to become pregnant How should I use this  medicine? Take this medicine by mouth with a glass of water. Follow the directions on the prescription label. You can take it with or without food. If it upsets your stomach, take it with food. Do not cut, crush or chew this medicine. Take your medicine at regular intervals. If you take this medicine more than once a day, take your second dose at least 8  hours after you take your first dose. To limit difficulty in sleeping, avoid taking this medicine at bedtime. Do not take your medicine more often than directed. Do not stop taking this medicine suddenly except upon the advice of your doctor. Stopping this medicine too quickly may cause serious side effects. A special MedGuide will be given to you by the pharmacist with each prescription and refill. Be sure to read this information carefully each time. Talk to your pediatrician regarding the use of this medicine in children. Special care may be needed. Overdosage: If you think you have taken too much of this medicine contact a poison control center or emergency room at once. NOTE: This medicine is only for you. Do not share this medicine with others. What if I miss a dose? If you miss a dose, skip the missed dose and take your next tablet at the regular time. There should be at least 8 hours between doses. Do not take double or extra doses. What may interact with this medicine? Do not take this medicine with any of the following medications:  linezolid  MAOIs like Azilect, Carbex, Eldepryl, Marplan, Nardil, and Parnate  methylene blue (injected into a vein)  other medicines that contain bupropion like Wellbutrin This medicine may also interact with the following medications:  alcohol  certain medicines for anxiety or sleep  certain medicines for blood pressure like metoprolol, propranolol  certain medicines for depression or psychotic disturbances  certain medicines for HIV or AIDS like efavirenz, lopinavir, nelfinavir,  ritonavir  certain medicines for irregular heart beat like propafenone, flecainide  certain medicines for Parkinson's disease like amantadine, levodopa  certain medicines for seizures like carbamazepine, phenytoin, phenobarbital  cimetidine  clopidogrel  cyclophosphamide  digoxin  furazolidone  isoniazid  nicotine  orphenadrine  procarbazine  steroid medicines like prednisone or cortisone  stimulant medicines for attention disorders, weight loss, or to stay awake  tamoxifen  theophylline  thiotepa  ticlopidine  tramadol  warfarin This list may not describe all possible interactions. Give your health care provider a list of all the medicines, herbs, non-prescription drugs, or dietary supplements you use. Also tell them if you smoke, drink alcohol, or use illegal drugs. Some items may interact with your medicine. What should I watch for while using this medicine? Visit your doctor or healthcare provider for regular checks on your progress. This medicine should be used together with a patient support program. It is important to participate in a behavioral program, counseling, or other support program that is recommended by your healthcare provider. This medicine may cause serious skin reactions. They can happen weeks to months after starting the medicine. Contact your healthcare provider right away if you notice fevers or flu-like symptoms with a rash. The rash may be red or purple and then turn into blisters or peeling of the skin. Or, you might notice a red rash with swelling of the face, lips or lymph nodes in your neck or under your arms. Patients and their families should watch out for new or worsening thoughts of suicide or depression. Also watch out for sudden changes in feelings such as feeling anxious, agitated, panicky, irritable, hostile, aggressive, impulsive, severely restless, overly excited and hyperactive, or not being able to sleep. If this happens,  especially at the beginning of treatment or after a change in dose, call your healthcare provider. Avoid alcoholic drinks while taking this medicine. Drinking excessive alcoholic beverages, using sleeping or anxiety medicines, or quickly stopping the use of these agents  while taking this medicine may increase your risk for a seizure. Do not drive or use heavy machinery until you know how this medicine affects you. This medicine can impair your ability to perform these tasks. Do not take this medicine close to bedtime. It may prevent you from sleeping. Your mouth may get dry. Chewing sugarless gum or sucking hard candy, and drinking plenty of water may help. Contact your doctor if the problem does not go away or is severe. Do not use nicotine patches or chewing gum without the advice of your doctor or healthcare provider while taking this medicine. You may need to have your blood pressure taken regularly if your doctor recommends that you use both nicotine and this medicine together. What side effects may I notice from receiving this medicine? Side effects that you should report to your doctor or health care professional as soon as possible:  allergic reactions like skin rash, itching or hives, swelling of the face, lips, or tongue  breathing problems  changes in vision  confusion  elevated mood, decreased need for sleep, racing thoughts, impulsive behavior  fast or irregular heartbeat  hallucinations, loss of contact with reality  increased blood pressure  rash, fever, and swollen lymph nodes  redness, blistering, peeling, or loosening of the skin, including inside the mouth  seizures  suicidal thoughts or other mood changes  unusually weak or tired  vomiting Side effects that usually do not require medical attention (report to your doctor or health care professional if they continue or are bothersome):  constipation  headache  loss of appetite  nausea  tremors  weight  loss This list may not describe all possible side effects. Call your doctor for medical advice about side effects. You may report side effects to FDA at 1-800-FDA-1088. Where should I keep my medicine? Keep out of the reach of children. Store at room temperature between 20 and 25 degrees C (68 and 77 degrees F). Protect from light. Keep container tightly closed. Throw away any unused medicine after the expiration date. NOTE: This sheet is a summary. It may not cover all possible information. If you have questions about this medicine, talk to your doctor, pharmacist, or health care provider.  2021 Elsevier/Gold Standard (2018-08-19 13:59:09)

## 2020-08-23 NOTE — Progress Notes (Signed)
Please call patient and let them  know their  low dose Ct was read as a Lung RADS 1, negative study: no nodules or definitely benign nodules. Radiology recommendation is for a repeat LDCT in 12 months. .Please let them  know we will order and schedule their  annual screening scan for 08/2021. Please let them  know there was notation of CAD on their  scan.  Please remind the patient  that this is a non-gated exam therefore degree or severity of disease  cannot be determined. Please have them  follow up with their PCP regarding potential risk factor modification, dietary therapy or pharmacologic therapy if clinically indicated. Pt.  is  currently on statin therapy. Please place order for annual  screening scan for  08/2021 and fax results to PCP. Thanks so much. 

## 2020-08-30 ENCOUNTER — Other Ambulatory Visit: Payer: Self-pay | Admitting: *Deleted

## 2020-08-30 DIAGNOSIS — Z87891 Personal history of nicotine dependence: Secondary | ICD-10-CM

## 2020-08-30 DIAGNOSIS — F17210 Nicotine dependence, cigarettes, uncomplicated: Secondary | ICD-10-CM

## 2020-09-04 ENCOUNTER — Other Ambulatory Visit (HOSPITAL_COMMUNITY): Payer: Self-pay | Admitting: Internal Medicine

## 2020-09-04 MED FILL — FREESTYLE LITE TEST STRIP: 90 days supply | Qty: 300 | Fill #1

## 2020-09-04 MED FILL — HUMALOG MIX 75-25 KWIKPEN: (75-25) 100 | 29 days supply | Qty: 15 | Fill #1

## 2020-09-08 ENCOUNTER — Other Ambulatory Visit (HOSPITAL_BASED_OUTPATIENT_CLINIC_OR_DEPARTMENT_OTHER): Payer: Self-pay

## 2020-09-09 ENCOUNTER — Other Ambulatory Visit (HOSPITAL_BASED_OUTPATIENT_CLINIC_OR_DEPARTMENT_OTHER): Payer: Self-pay

## 2020-09-09 MED FILL — Insulin Lispro-aabc Inj 100 Unit/ML: INTRAMUSCULAR | 90 days supply | Qty: 90 | Fill #0 | Status: CN

## 2020-09-09 MED FILL — Insulin Lispro-aabc Inj 100 Unit/ML: INTRAMUSCULAR | 30 days supply | Qty: 30 | Fill #0 | Status: AC

## 2020-09-10 ENCOUNTER — Other Ambulatory Visit (HOSPITAL_BASED_OUTPATIENT_CLINIC_OR_DEPARTMENT_OTHER): Payer: Self-pay

## 2020-10-05 ENCOUNTER — Other Ambulatory Visit: Payer: Self-pay

## 2020-10-05 ENCOUNTER — Other Ambulatory Visit (HOSPITAL_BASED_OUTPATIENT_CLINIC_OR_DEPARTMENT_OTHER): Payer: Self-pay

## 2020-10-05 ENCOUNTER — Encounter: Payer: Self-pay | Admitting: Medical

## 2020-10-05 ENCOUNTER — Telehealth (INDEPENDENT_AMBULATORY_CARE_PROVIDER_SITE_OTHER): Payer: 59 | Admitting: Medical

## 2020-10-05 DIAGNOSIS — J301 Allergic rhinitis due to pollen: Secondary | ICD-10-CM | POA: Diagnosis not present

## 2020-10-05 DIAGNOSIS — R059 Cough, unspecified: Secondary | ICD-10-CM

## 2020-10-05 MED ORDER — FLUTICASONE PROPIONATE 50 MCG/ACT NA SUSP
2.0000 | Freq: Every day | NASAL | 1 refills | Status: DC
  Filled 2020-10-05: qty 16, 30d supply, fill #0

## 2020-10-05 MED ORDER — LEVOCETIRIZINE DIHYDROCHLORIDE 5 MG PO TABS
5.0000 mg | ORAL_TABLET | Freq: Every evening | ORAL | 3 refills | Status: AC
  Filled 2020-10-05: qty 30, 30d supply, fill #0

## 2020-10-05 MED ORDER — BENZONATATE 100 MG PO CAPS
100.0000 mg | ORAL_CAPSULE | Freq: Three times a day (TID) | ORAL | 0 refills | Status: DC | PRN
  Filled 2020-10-05: qty 30, 10d supply, fill #0

## 2020-10-05 MED ORDER — AZITHROMYCIN 250 MG PO TABS
ORAL_TABLET | ORAL | 0 refills | Status: AC
  Filled 2020-10-05: qty 6, 5d supply, fill #0

## 2020-10-05 NOTE — Progress Notes (Signed)
   Subjective:    Patient ID: [name], male    DOB: 01/12/1966, 55 y.o.   MRN: [id number]  HPI   Virtual Visit via Video Note  I connected with [name] on 10/05/20 at  8:40 AM EDT by a video enabled telemedicine application and verified that I am speaking with the correct person using two identifiers.  Location: Patient: home Provider: office     I discussed the limitations of evaluation and management by telemedicine and the availability of in person appointments. The patient expressed understanding and agreed to proceed.    History of Present Illness: Pt has cough for 3 weeks. Pt states can feel pnd, nasal congestion and occasional.   Pt has been using tablet for congestion. Tab viewed on screen. No decongestants.  Cough is not productive except sometimes. Pt does smoke. He is working on cutting back.  No sinus pain. No fever, no chills or sweats.  No heart burn/gerd.  Denies any history of allergies this time of year.  Pt has been vaccinated against covid and got booster.    Observations/Objective: General-no acute distress, pleasant, oriented. Lungs- on inspection lungs appear unlabored. Neck- no tracheal deviation or jvd on inspection. Neuro- gross motor function appears intact. heent- no sinus pain on self palpation.  Assessment and Plan: Probable allergic rhinitis.  We will treat Xyzal antihistamine, Flonase nasal spray and prescription of benzonatate for cough.  If transient productive cough features worsen, chest congestion or sinus pressure/pain then can start azithromycin antibiotic.  Making antibiotic available today but advised not to start unless symptoms persist or worsen as we discussed.  If signs and symptoms lingering and you require in office visit for better evaluation then recommend doing rapid COVID test over-the-counter.  If negative then should be able to get you scheduled for an office visit if needed.  Follow-up in 7  to 10 days or as needed.  Time spent with patient today was 25  minutes which consisted of chart revdiew, discussing diagnosis, work up treatment and documentation.  Follow Up Instructions:    I discussed the assessment and treatment plan with the patient. The patient was provided an opportunity to ask questions and all were answered. The patient agreed with the plan and demonstrated an understanding of the instructions.   The patient was advised to call back or seek an in-person evaluation if the symptoms worsen or if the condition fails to improve as anticipated.  Time spent with patient today was  minutes which consisted of chart revdiew, discussing diagnosis, work up treatment and documentation.   Esperanza Richters, PA-C    Review of Systems  Constitutional: Negative for chills, fatigue and fever.  HENT: Positive for congestion and postnasal drip. Negative for sinus pressure and sore throat.   Respiratory: Negative for shortness of breath and wheezing.   Cardiovascular: Negative for chest pain and palpitations.  Gastrointestinal: Negative for abdominal pain, nausea and vomiting.  Musculoskeletal: Negative for back pain.  Skin: Negative for rash.  Hematological: Negative for adenopathy. Does not bruise/bleed easily.  Psychiatric/Behavioral: Negative for behavioral problems and sleep disturbance. The patient is not nervous/anxious.        Objective:   Physical Exam        Assessment & Plan:

## 2020-10-05 NOTE — Patient Instructions (Signed)
Probable allergic rhinitis.  We will treat Xyzal antihistamine, Flonase nasal spray and prescription of benzonatate for cough.  If transient productive cough features worsen, chest congestion or sinus pressure/pain then can start azithromycin antibiotic.  Making antibiotic available today but advised not to start unless symptoms persist or worsen as we discussed.  If signs and symptoms lingering and you require in office visit for better evaluation then recommend doing rapid COVID test over-the-counter.  If negative then should be able to get you scheduled for an office visit if needed.  Follow-up in 7 to 10 days or as needed.

## 2020-10-09 DIAGNOSIS — E1165 Type 2 diabetes mellitus with hyperglycemia: Secondary | ICD-10-CM | POA: Diagnosis not present

## 2020-12-03 ENCOUNTER — Other Ambulatory Visit (HOSPITAL_BASED_OUTPATIENT_CLINIC_OR_DEPARTMENT_OTHER): Payer: Self-pay

## 2020-12-03 MED ORDER — LYUMJEV 100 UNIT/ML IJ SOLN
INTRAMUSCULAR | 3 refills | Status: AC
  Filled 2020-12-03: qty 90, 90d supply, fill #0

## 2020-12-04 ENCOUNTER — Other Ambulatory Visit (HOSPITAL_BASED_OUTPATIENT_CLINIC_OR_DEPARTMENT_OTHER): Payer: Self-pay

## 2020-12-06 ENCOUNTER — Other Ambulatory Visit (HOSPITAL_BASED_OUTPATIENT_CLINIC_OR_DEPARTMENT_OTHER): Payer: Self-pay

## 2020-12-14 ENCOUNTER — Other Ambulatory Visit (HOSPITAL_COMMUNITY): Payer: Self-pay

## 2021-01-03 ENCOUNTER — Other Ambulatory Visit (HOSPITAL_BASED_OUTPATIENT_CLINIC_OR_DEPARTMENT_OTHER): Payer: Self-pay

## 2021-01-03 ENCOUNTER — Other Ambulatory Visit: Payer: Self-pay

## 2021-01-03 MED ORDER — SILDENAFIL CITRATE 100 MG PO TABS
100.0000 mg | ORAL_TABLET | Freq: Every day | ORAL | 0 refills | Status: AC | PRN
  Filled 2021-01-03: qty 6, 30d supply, fill #0
  Filled 2021-10-28: qty 4, 20d supply, fill #1

## 2021-01-04 DIAGNOSIS — E1165 Type 2 diabetes mellitus with hyperglycemia: Secondary | ICD-10-CM | POA: Diagnosis not present

## 2021-01-22 ENCOUNTER — Other Ambulatory Visit (HOSPITAL_BASED_OUTPATIENT_CLINIC_OR_DEPARTMENT_OTHER): Payer: Self-pay

## 2021-01-22 DIAGNOSIS — E291 Testicular hypofunction: Secondary | ICD-10-CM | POA: Diagnosis not present

## 2021-01-22 DIAGNOSIS — E559 Vitamin D deficiency, unspecified: Secondary | ICD-10-CM | POA: Diagnosis not present

## 2021-01-22 DIAGNOSIS — F17200 Nicotine dependence, unspecified, uncomplicated: Secondary | ICD-10-CM | POA: Diagnosis not present

## 2021-01-22 DIAGNOSIS — B351 Tinea unguium: Secondary | ICD-10-CM | POA: Diagnosis not present

## 2021-01-22 DIAGNOSIS — E1165 Type 2 diabetes mellitus with hyperglycemia: Secondary | ICD-10-CM | POA: Diagnosis not present

## 2021-01-22 MED ORDER — CICLOPIROX 8 % EX SOLN
CUTANEOUS | 3 refills | Status: DC
  Filled 2021-01-22: qty 6.6, 30d supply, fill #0

## 2021-01-22 MED ORDER — ATORVASTATIN CALCIUM 10 MG PO TABS
ORAL_TABLET | ORAL | 3 refills | Status: AC
  Filled 2021-01-22: qty 90, 90d supply, fill #0

## 2021-01-24 ENCOUNTER — Other Ambulatory Visit (HOSPITAL_BASED_OUTPATIENT_CLINIC_OR_DEPARTMENT_OTHER): Payer: Self-pay

## 2021-04-03 DIAGNOSIS — E1165 Type 2 diabetes mellitus with hyperglycemia: Secondary | ICD-10-CM | POA: Diagnosis not present

## 2021-04-17 ENCOUNTER — Other Ambulatory Visit (HOSPITAL_COMMUNITY): Payer: Self-pay

## 2021-05-06 IMAGING — MR MR CERVICAL SPINE WO/W CM
5 of 9 series · 18 of 48 positions shown · IV contrast (gadavist)
Comparison: Brain MRI earlier today. CTA head and neck yesterday.

CLINICAL DATA: 53-year-old male with multiple sclerosis. Hyper
reflexia. Left arm numbness.

EXAM:
MRI CERVICAL SPINE WITHOUT AND WITH CONTRAST
TECHNIQUE: Multiplanar and multiecho pulse sequences of the cervical spine, to
include the craniocervical junction and cervicothoracic junction,
were obtained without and with intravenous contrast.
CONTRAST:  10mL GADAVIST GADOBUTROL 1 MMOL/ML IV SOLN

[Series 2: T2 · sagittal · 3.0mm · 0.39mm/px · 3 of 13 slices shown (1 of 3)]
[im 1/13]
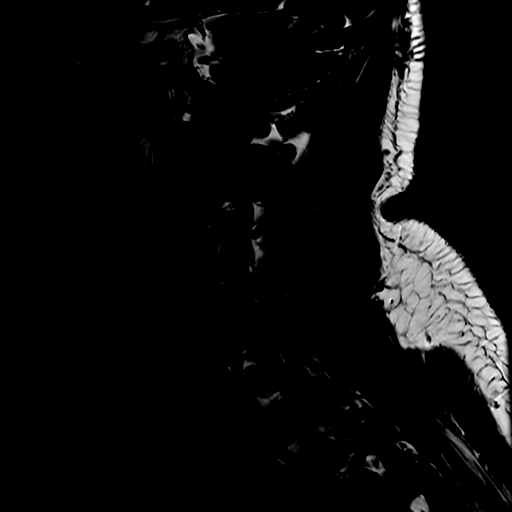
[im 7/13]
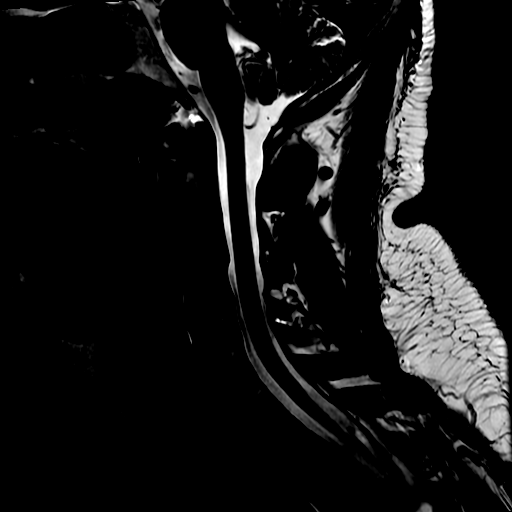
[im 13/13]
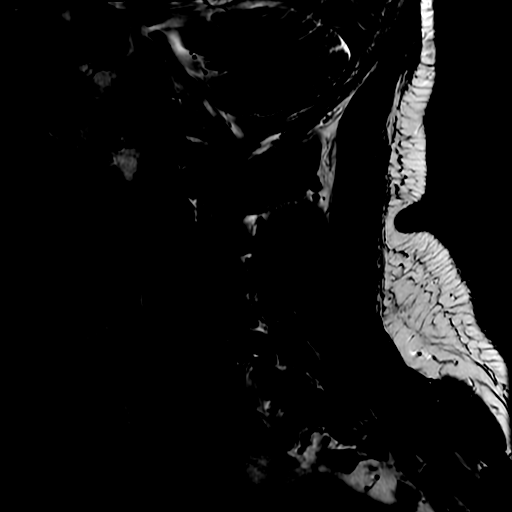

[Series 5: T2 · sagittal · 3.0mm · 0.39mm/px · 2 of 13 slices shown (2 of 3)]
[im 1/13]
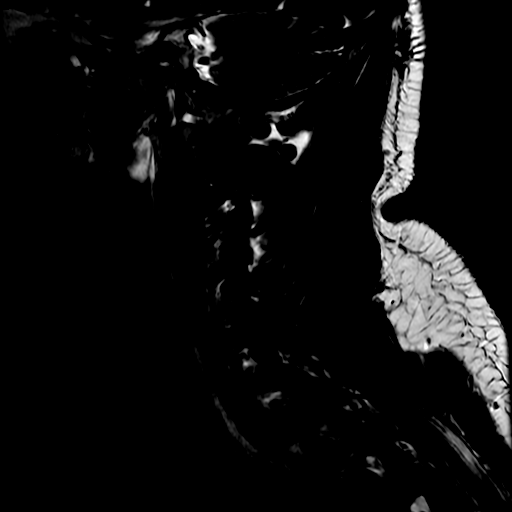
[im 13/13]
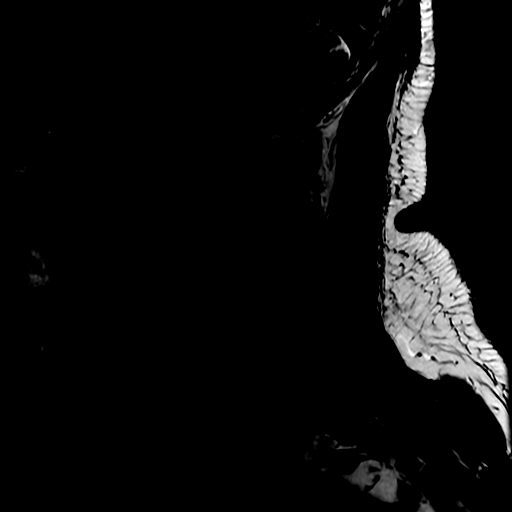

[Series 7: T2 · axial · 3.0mm · 0.35mm/px · z∈[-87,+17]mm · 6 of 32 slices shown (3 of 3)]
[im 1/32]
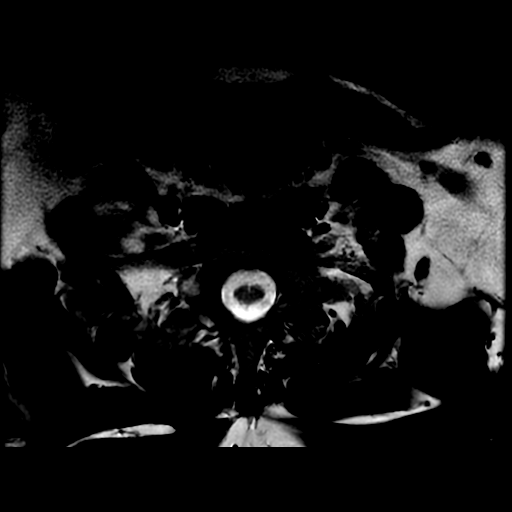
[im 7/32]
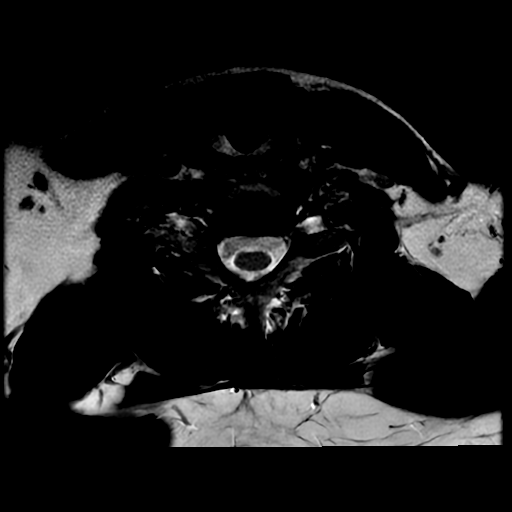
[im 13/32]
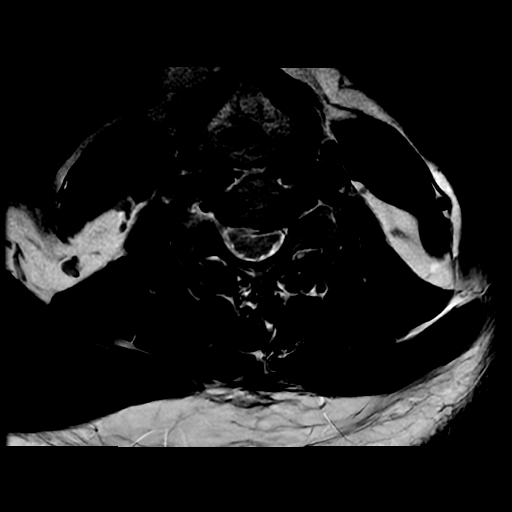
[im 19/32]
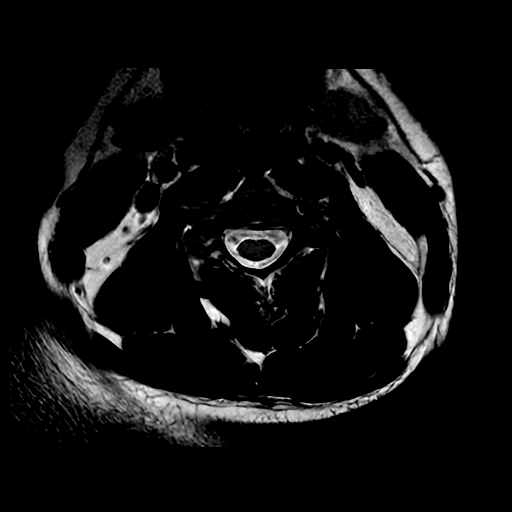
[im 25/32]
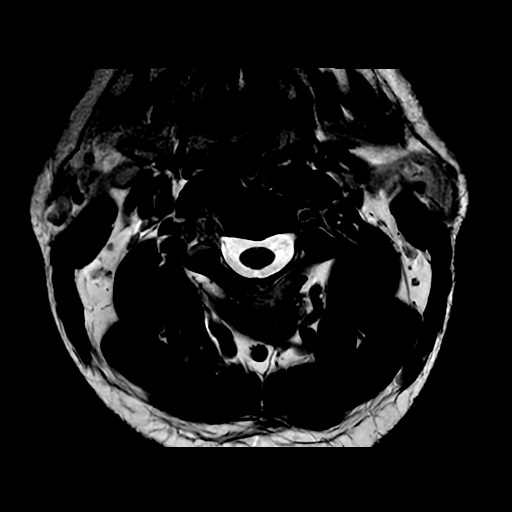
[im 32/32]
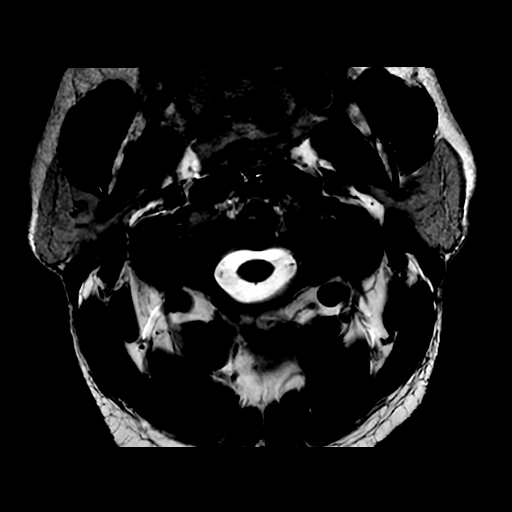

[Series 8: T1 · axial · non-contrast · 3.0mm · 0.35mm/px · z∈[-87,+17]mm · 6 of 32 slices shown]
[im 1/32]
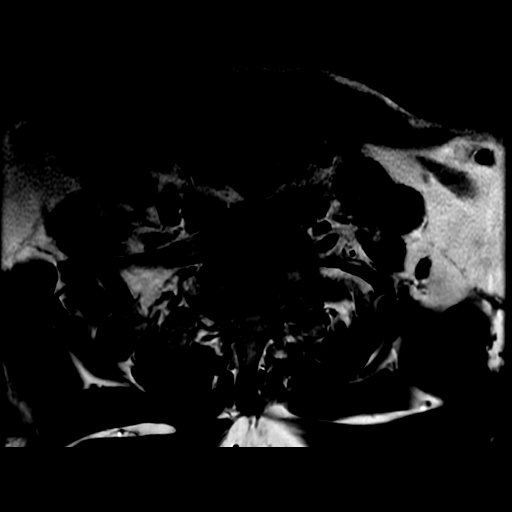
[im 7/32]
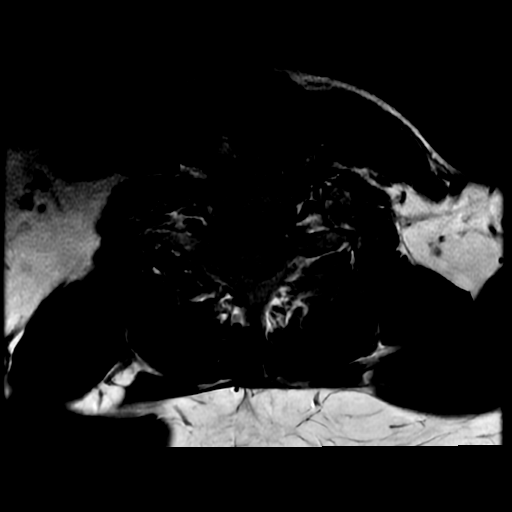
[im 13/32]
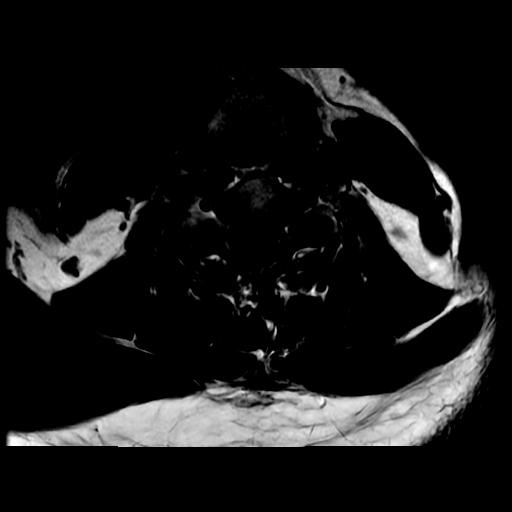
[im 19/32]
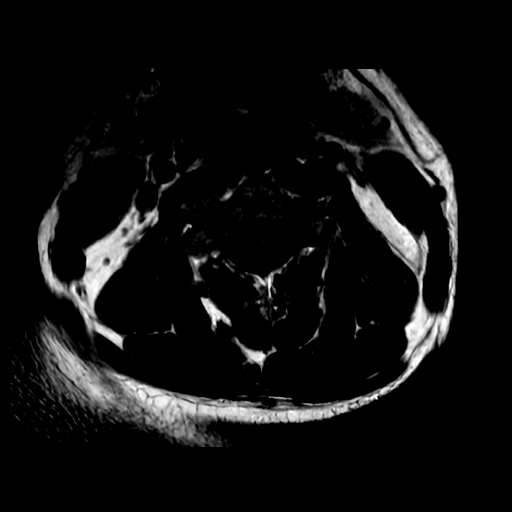
[im 25/32]
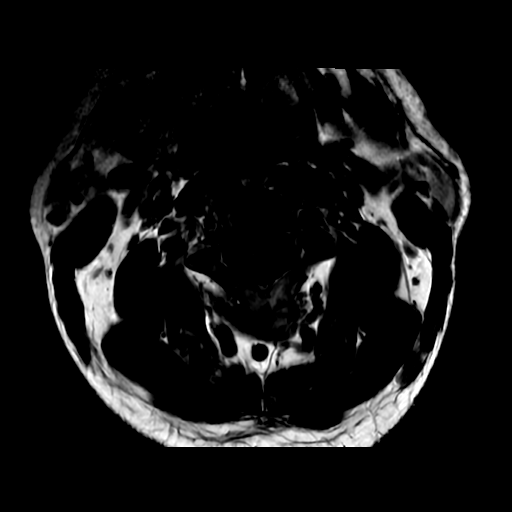
[im 32/32]
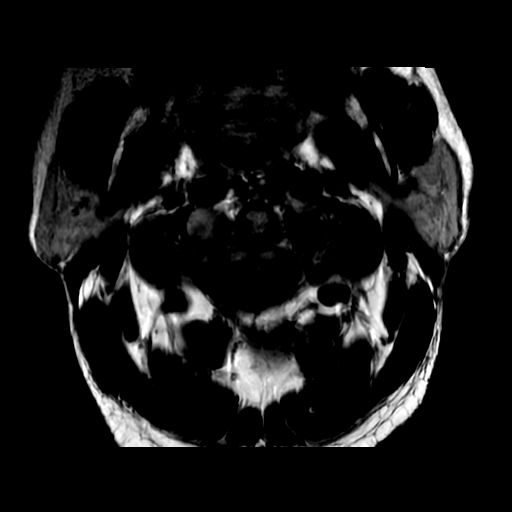

[Series 9: T1 fat-sat post-contrast · sagittal · 3.0mm · 0.39mm/px · 1 of 13 slices shown]
[im 1/13]
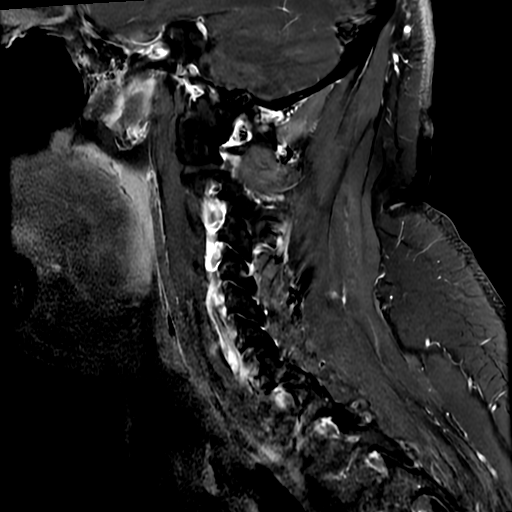

[18 of 48 positions shown; findings below may reference images not displayed]

FINDINGS: Alignment: Preserved cervical lordosis. No spondylolisthesis.

Vertebrae: No marrow edema or evidence of acute osseous abnormality.
Visualized bone marrow signal is within normal limits.

Cord: The cervical and visible upper thoracic spinal cord appear
normal. Fairly capacious spinal canal. No convincing abnormal cord
signal. No abnormal intradural enhancement. No dural thickening.

Posterior Fossa, vertebral arteries, paraspinal tissues:
Cervicomedullary junction is within normal limits. Negative visible
posterior fossa.

Preserved major vascular flow voids in the neck. Negative neck soft
tissues.

Disc levels:

C2-C3:  Negative.

C3-C4: Mild foraminal endplate spurring. Mild bilateral C4 foraminal
stenosis.

C4-C5: Mild disc bulge and endplate spurring mostly affecting the
neural foramina. Mild facet hypertrophy. No spinal stenosis.
Moderate to severe bilateral C5 foraminal stenosis.

C5-C6: Circumferential disc bulge and endplate spurring. Mild facet
hypertrophy. No spinal stenosis. Moderate left and mild-to-moderate
right C6 foraminal stenosis.

C6-C7:  Negative.

C7-T1:  Mild facet hypertrophy greater on the left. No stenosis.

Negative visible upper thoracic levels.
IMPRESSION: 1. No evidence of cervical spinal cord demyelination.
2. Generally mild for age cervical spine degeneration with no spinal
stenosis. But there is moderate to severe bilateral C5 and left C6
neural foraminal stenosis.

## 2021-05-07 DIAGNOSIS — E1165 Type 2 diabetes mellitus with hyperglycemia: Secondary | ICD-10-CM | POA: Diagnosis not present

## 2021-05-07 DIAGNOSIS — F17200 Nicotine dependence, unspecified, uncomplicated: Secondary | ICD-10-CM | POA: Diagnosis not present

## 2021-05-07 DIAGNOSIS — E559 Vitamin D deficiency, unspecified: Secondary | ICD-10-CM | POA: Diagnosis not present

## 2021-05-07 DIAGNOSIS — B351 Tinea unguium: Secondary | ICD-10-CM | POA: Diagnosis not present

## 2021-05-07 DIAGNOSIS — E291 Testicular hypofunction: Secondary | ICD-10-CM | POA: Diagnosis not present

## 2021-05-08 ENCOUNTER — Other Ambulatory Visit (HOSPITAL_BASED_OUTPATIENT_CLINIC_OR_DEPARTMENT_OTHER): Payer: Self-pay

## 2021-05-08 MED ORDER — TRULICITY 0.75 MG/0.5ML ~~LOC~~ SOAJ
SUBCUTANEOUS | 2 refills | Status: AC
  Filled 2021-05-08: qty 2, 28d supply, fill #0

## 2021-05-10 ENCOUNTER — Other Ambulatory Visit (HOSPITAL_BASED_OUTPATIENT_CLINIC_OR_DEPARTMENT_OTHER): Payer: Self-pay

## 2021-05-10 MED ORDER — LYUMJEV 100 UNIT/ML IJ SOLN
INTRAMUSCULAR | 3 refills | Status: AC
  Filled 2021-05-10: qty 90, 90d supply, fill #0

## 2021-05-16 ENCOUNTER — Other Ambulatory Visit (HOSPITAL_BASED_OUTPATIENT_CLINIC_OR_DEPARTMENT_OTHER): Payer: Self-pay

## 2021-05-17 ENCOUNTER — Other Ambulatory Visit (HOSPITAL_BASED_OUTPATIENT_CLINIC_OR_DEPARTMENT_OTHER): Payer: Self-pay

## 2021-05-24 ENCOUNTER — Other Ambulatory Visit (HOSPITAL_BASED_OUTPATIENT_CLINIC_OR_DEPARTMENT_OTHER): Payer: Self-pay

## 2021-05-24 MED ORDER — INSULIN PEN NEEDLE 31G X 5 MM MISC
3 refills | Status: AC
Start: 2021-05-24 — End: ?
  Filled 2021-05-24: qty 100, 90d supply, fill #0
  Filled 2022-05-20: qty 100, 30d supply, fill #0

## 2021-05-28 ENCOUNTER — Other Ambulatory Visit (HOSPITAL_BASED_OUTPATIENT_CLINIC_OR_DEPARTMENT_OTHER): Payer: Self-pay

## 2021-06-12 ENCOUNTER — Other Ambulatory Visit (HOSPITAL_BASED_OUTPATIENT_CLINIC_OR_DEPARTMENT_OTHER): Payer: Self-pay

## 2021-06-12 DIAGNOSIS — E1165 Type 2 diabetes mellitus with hyperglycemia: Secondary | ICD-10-CM | POA: Diagnosis not present

## 2021-07-01 DIAGNOSIS — E1165 Type 2 diabetes mellitus with hyperglycemia: Secondary | ICD-10-CM | POA: Diagnosis not present

## 2021-07-08 DIAGNOSIS — E1165 Type 2 diabetes mellitus with hyperglycemia: Secondary | ICD-10-CM | POA: Diagnosis not present

## 2021-07-13 DIAGNOSIS — E1165 Type 2 diabetes mellitus with hyperglycemia: Secondary | ICD-10-CM | POA: Diagnosis not present

## 2021-08-10 DIAGNOSIS — E1165 Type 2 diabetes mellitus with hyperglycemia: Secondary | ICD-10-CM | POA: Diagnosis not present

## 2021-08-13 ENCOUNTER — Other Ambulatory Visit (HOSPITAL_BASED_OUTPATIENT_CLINIC_OR_DEPARTMENT_OTHER): Payer: Self-pay

## 2021-08-13 DIAGNOSIS — E1165 Type 2 diabetes mellitus with hyperglycemia: Secondary | ICD-10-CM | POA: Diagnosis not present

## 2021-08-13 DIAGNOSIS — E559 Vitamin D deficiency, unspecified: Secondary | ICD-10-CM | POA: Diagnosis not present

## 2021-08-13 DIAGNOSIS — E291 Testicular hypofunction: Secondary | ICD-10-CM | POA: Diagnosis not present

## 2021-08-13 DIAGNOSIS — F17200 Nicotine dependence, unspecified, uncomplicated: Secondary | ICD-10-CM | POA: Diagnosis not present

## 2021-08-13 DIAGNOSIS — B351 Tinea unguium: Secondary | ICD-10-CM | POA: Diagnosis not present

## 2021-08-13 MED ORDER — TRULICITY 1.5 MG/0.5ML ~~LOC~~ SOAJ
1.5000 mg | SUBCUTANEOUS | 1 refills | Status: AC
  Filled 2021-08-13: qty 6, 84d supply, fill #0

## 2021-08-13 MED ORDER — LYUMJEV 100 UNIT/ML IJ SOLN
INTRAMUSCULAR | 3 refills | Status: AC
  Filled 2021-08-13: qty 90, 90d supply, fill #0
  Filled 2022-05-02: qty 30, 30d supply, fill #1

## 2021-08-13 MED ORDER — ATORVASTATIN CALCIUM 10 MG PO TABS
10.0000 mg | ORAL_TABLET | Freq: Every day | ORAL | 3 refills | Status: AC
  Filled 2021-08-13: qty 90, 90d supply, fill #0

## 2021-08-16 ENCOUNTER — Other Ambulatory Visit (HOSPITAL_BASED_OUTPATIENT_CLINIC_OR_DEPARTMENT_OTHER): Payer: Self-pay

## 2021-08-27 ENCOUNTER — Other Ambulatory Visit (HOSPITAL_BASED_OUTPATIENT_CLINIC_OR_DEPARTMENT_OTHER): Payer: Self-pay

## 2021-09-10 DIAGNOSIS — E1165 Type 2 diabetes mellitus with hyperglycemia: Secondary | ICD-10-CM | POA: Diagnosis not present

## 2021-09-26 DIAGNOSIS — E1165 Type 2 diabetes mellitus with hyperglycemia: Secondary | ICD-10-CM | POA: Diagnosis not present

## 2021-10-03 ENCOUNTER — Telehealth: Payer: Self-pay

## 2021-10-03 NOTE — Telephone Encounter (Signed)
Left voicemail with call back number for patient to call to schedule annual LDCT  

## 2021-10-07 ENCOUNTER — Other Ambulatory Visit: Payer: Self-pay

## 2021-10-07 DIAGNOSIS — Z87891 Personal history of nicotine dependence: Secondary | ICD-10-CM

## 2021-10-07 DIAGNOSIS — F17210 Nicotine dependence, cigarettes, uncomplicated: Secondary | ICD-10-CM

## 2021-10-07 DIAGNOSIS — Z122 Encounter for screening for malignant neoplasm of respiratory organs: Secondary | ICD-10-CM

## 2021-10-28 ENCOUNTER — Other Ambulatory Visit (HOSPITAL_BASED_OUTPATIENT_CLINIC_OR_DEPARTMENT_OTHER): Payer: Self-pay

## 2021-11-19 ENCOUNTER — Other Ambulatory Visit (HOSPITAL_BASED_OUTPATIENT_CLINIC_OR_DEPARTMENT_OTHER): Payer: Self-pay

## 2021-11-19 DIAGNOSIS — E559 Vitamin D deficiency, unspecified: Secondary | ICD-10-CM | POA: Diagnosis not present

## 2021-11-19 DIAGNOSIS — B351 Tinea unguium: Secondary | ICD-10-CM | POA: Diagnosis not present

## 2021-11-19 DIAGNOSIS — E1165 Type 2 diabetes mellitus with hyperglycemia: Secondary | ICD-10-CM | POA: Diagnosis not present

## 2021-11-19 DIAGNOSIS — E291 Testicular hypofunction: Secondary | ICD-10-CM | POA: Diagnosis not present

## 2021-11-19 DIAGNOSIS — R809 Proteinuria, unspecified: Secondary | ICD-10-CM | POA: Diagnosis not present

## 2021-11-19 DIAGNOSIS — F17200 Nicotine dependence, unspecified, uncomplicated: Secondary | ICD-10-CM | POA: Diagnosis not present

## 2021-11-19 MED ORDER — TRULICITY 3 MG/0.5ML ~~LOC~~ SOAJ
SUBCUTANEOUS | 1 refills | Status: AC
  Filled 2021-11-19 – 2021-12-04 (×2): qty 6, 84d supply, fill #0

## 2021-11-20 ENCOUNTER — Other Ambulatory Visit (HOSPITAL_BASED_OUTPATIENT_CLINIC_OR_DEPARTMENT_OTHER): Payer: Self-pay

## 2021-11-28 ENCOUNTER — Ambulatory Visit (INDEPENDENT_AMBULATORY_CARE_PROVIDER_SITE_OTHER): Payer: 59

## 2021-11-28 ENCOUNTER — Other Ambulatory Visit (HOSPITAL_BASED_OUTPATIENT_CLINIC_OR_DEPARTMENT_OTHER): Payer: Self-pay

## 2021-11-28 DIAGNOSIS — Z87891 Personal history of nicotine dependence: Secondary | ICD-10-CM

## 2021-11-28 DIAGNOSIS — F17210 Nicotine dependence, cigarettes, uncomplicated: Secondary | ICD-10-CM

## 2021-11-28 DIAGNOSIS — Z122 Encounter for screening for malignant neoplasm of respiratory organs: Secondary | ICD-10-CM

## 2021-12-02 ENCOUNTER — Other Ambulatory Visit: Payer: Self-pay

## 2021-12-02 DIAGNOSIS — E1165 Type 2 diabetes mellitus with hyperglycemia: Secondary | ICD-10-CM | POA: Diagnosis not present

## 2021-12-02 DIAGNOSIS — Z122 Encounter for screening for malignant neoplasm of respiratory organs: Secondary | ICD-10-CM

## 2021-12-02 DIAGNOSIS — F17210 Nicotine dependence, cigarettes, uncomplicated: Secondary | ICD-10-CM

## 2021-12-02 DIAGNOSIS — Z87891 Personal history of nicotine dependence: Secondary | ICD-10-CM

## 2021-12-04 ENCOUNTER — Other Ambulatory Visit (HOSPITAL_BASED_OUTPATIENT_CLINIC_OR_DEPARTMENT_OTHER): Payer: Self-pay

## 2021-12-12 DIAGNOSIS — E1165 Type 2 diabetes mellitus with hyperglycemia: Secondary | ICD-10-CM | POA: Diagnosis not present

## 2021-12-24 DIAGNOSIS — E1165 Type 2 diabetes mellitus with hyperglycemia: Secondary | ICD-10-CM | POA: Diagnosis not present

## 2021-12-27 ENCOUNTER — Other Ambulatory Visit (HOSPITAL_BASED_OUTPATIENT_CLINIC_OR_DEPARTMENT_OTHER): Payer: Self-pay

## 2021-12-27 ENCOUNTER — Ambulatory Visit (INDEPENDENT_AMBULATORY_CARE_PROVIDER_SITE_OTHER): Payer: 59 | Admitting: Medical

## 2021-12-27 VITALS — BP 115/75 | HR 80 | Temp 96.9°F | Resp 18 | Ht 70.0 in | Wt 218.8 lb

## 2021-12-27 DIAGNOSIS — Z0000 Encounter for general adult medical examination without abnormal findings: Secondary | ICD-10-CM | POA: Diagnosis not present

## 2021-12-27 DIAGNOSIS — E119 Type 2 diabetes mellitus without complications: Secondary | ICD-10-CM

## 2021-12-27 DIAGNOSIS — Z125 Encounter for screening for malignant neoplasm of prostate: Secondary | ICD-10-CM

## 2021-12-27 DIAGNOSIS — Z1211 Encounter for screening for malignant neoplasm of colon: Secondary | ICD-10-CM

## 2021-12-27 DIAGNOSIS — E088 Diabetes mellitus due to underlying condition with unspecified complications: Secondary | ICD-10-CM | POA: Diagnosis not present

## 2021-12-27 DIAGNOSIS — G4700 Insomnia, unspecified: Secondary | ICD-10-CM

## 2021-12-27 DIAGNOSIS — M25511 Pain in right shoulder: Secondary | ICD-10-CM | POA: Diagnosis not present

## 2021-12-27 DIAGNOSIS — Z0100 Encounter for examination of eyes and vision without abnormal findings: Secondary | ICD-10-CM

## 2021-12-27 DIAGNOSIS — F17200 Nicotine dependence, unspecified, uncomplicated: Secondary | ICD-10-CM | POA: Diagnosis not present

## 2021-12-27 MED ORDER — TRAZODONE HCL 50 MG PO TABS
25.0000 mg | ORAL_TABLET | Freq: Every evening | ORAL | 3 refills | Status: AC | PRN
  Filled 2021-12-27: qty 30, 30d supply, fill #0

## 2021-12-27 NOTE — Patient Instructions (Addendum)
For you wellness exam today I have ordered cbc, cmp  and lipid panel. Include psa.  Shingrix vaccine  declined today.  Recommend exercise and healthy diet.  We will let you know lab results as they come in.  Follow up date appointment will be determined after lab review.    Placed referral for colonoscopy.  Refer for diabetic eye exam.   Continue smoking cessation efforts.  For insomnia rx trazadone. Stop alleve pm.  Rt shoulder pain. Will refer to sport med MD. Evaluate and treat.   Preventive Care 109-58 Years Old, Male Preventive care refers to lifestyle choices and visits with your health care provider that can promote health and wellness. Preventive care visits are also called wellness exams. What can I expect for my preventive care visit? Counseling During your preventive care visit, your health care provider may ask about your: Medical history, including: Past medical problems. Family medical history. Current health, including: Emotional well-being. Home life and relationship well-being. Sexual activity. Lifestyle, including: Alcohol, nicotine or tobacco, and drug use. Access to firearms. Diet, exercise, and sleep habits. Safety issues such as seatbelt and bike helmet use. Sunscreen use. Work and work Astronomer. Physical exam Your health care provider will check your: Height and weight. These may be used to calculate your BMI (body mass index). BMI is a measurement that tells if you are at a healthy weight. Waist circumference. This measures the distance around your waistline. This measurement also tells if you are at a healthy weight and may help predict your risk of certain diseases, such as type 2 diabetes and high blood pressure. Heart rate and blood pressure. Body temperature. Skin for abnormal spots. What immunizations do I need?  Vaccines are usually given at various ages, according to a schedule. Your health care provider will recommend vaccines for  you based on your age, medical history, and lifestyle or other factors, such as travel or where you work. What tests do I need? Screening Your health care provider may recommend screening tests for certain conditions. This may include: Lipid and cholesterol levels. Diabetes screening. This is done by checking your blood sugar (glucose) after you have not eaten for a while (fasting). Hepatitis B test. Hepatitis C test. HIV (human immunodeficiency virus) test. STI (sexually transmitted infection) testing, if you are at risk. Lung cancer screening. Prostate cancer screening. Colorectal cancer screening. Talk with your health care provider about your test results, treatment options, and if necessary, the need for more tests. Follow these instructions at home: Eating and drinking  Eat a diet that includes fresh fruits and vegetables, whole grains, lean protein, and low-fat dairy products. Take vitamin and mineral supplements as recommended by your health care provider. Do not drink alcohol if your health care provider tells you not to drink. If you drink alcohol: Limit how much you have to 0-2 drinks a day. Know how much alcohol is in your drink. In the [place] one drink equals one 12 oz bottle of beer (355 mL), one 5 oz glass of wine (148 mL), or one 1 oz glass of hard liquor (44 mL). Lifestyle Brush your teeth every morning and night with fluoride toothpaste. Floss one time each day. Exercise for at least 30 minutes 5 or more days each week. Do not use any products that contain nicotine or tobacco. These products include cigarettes, chewing tobacco, and vaping devices, such as e-cigarettes. If you need help quitting, ask your health care provider. Do not use drugs. If you are sexually  active, practice safe sex. Use a condom or other form of protection to prevent STIs. Take aspirin only as told by your health care provider. Make sure that you understand how much to take and what form to take.  Work with your health care provider to find out whether it is safe and beneficial for you to take aspirin daily. Find healthy ways to manage stress, such as: Meditation, yoga, or listening to music. Journaling. Talking to a trusted person. Spending time with friends and family. Minimize exposure to UV radiation to reduce your risk of skin cancer. Safety Always wear your seat belt while driving or riding in a vehicle. Do not drive: If you have been drinking alcohol. Do not ride with someone who has been drinking. When you are tired or distracted. While texting. If you have been using any mind-altering substances or drugs. Wear a helmet and other protective equipment during sports activities. If you have firearms in your house, make sure you follow all gun safety procedures. What's next? Go to your health care provider once a year for an annual wellness visit. Ask your health care provider how often you should have your eyes and teeth checked. Stay up to date on all vaccines. This information is not intended to replace advice given to you by your health care provider. Make sure you discuss any questions you have with your health care provider. Document Revised: 11/21/2020 Document Reviewed: 11/21/2020 Elsevier Patient Education  2023 [employer].

## 2021-12-27 NOTE — Progress Notes (Signed)
Subjective:    Patient ID: [name], male    DOB: 06-26-1965, 56 y.o.   MRN: [id number]  HPI Pt in for wellness exam. CPE.   Pt is smoker. He is smoking 1/2 pack a day. Pt trying to avoid high cholesterol foods.  Exercise 4 days a week at [employer]. No alcohol. No drugs.  Pt seeing endocrine for diabetes.   Needs to have colonoscopy.   Declines shingrix vaccine today.  6 month of insominia. 5-6 hours at night. Trouble falling asleep. Taking alleve pm for past 6 month.   Also rt shoulder pain recently for months. No fall or trauma. Hx of shoulder dislocation when younger.   Review of Systems  Constitutional:  Negative for chills, fatigue and fever.  HENT:  Negative for congestion, postnasal drip, sinus pressure and sore throat.   Respiratory:  Negative for shortness of breath and wheezing.   Cardiovascular:  Negative for chest pain and palpitations.  Gastrointestinal:  Negative for abdominal pain, nausea and vomiting.  Genitourinary:  Negative for frequency.  Musculoskeletal:  Negative for back pain.  Skin:  Negative for rash.  Neurological:  Negative for dizziness, seizures and numbness.  Hematological:  Negative for adenopathy. Does not bruise/bleed easily.  Psychiatric/Behavioral:  Positive for sleep disturbance. Negative for behavioral problems. The patient is not nervous/anxious.     Past Medical History:  Diagnosis Date   Diabetes mellitus without complication (HCC)      Social History   Socioeconomic History   Marital status: Married    Spouse name: Not on file   Number of children: Not on file   Years of education: Not on file   Highest education level: Not on file  Occupational History   Not on file  Tobacco Use   Smoking status: Every Day    Packs/day: 1.00    Years: 30.00    Total pack years: 30.00    Types: Cigarettes    Start date: 10/29/1984   Smokeless tobacco: Never  Substance and Sexual Activity   Alcohol use: No     Alcohol/week: 0.0 standard drinks of alcohol   Drug use: No   Sexual activity: Yes  Other Topics Concern   Not on file  Social History Narrative   Not on file   Social Determinants of Health   Financial Resource Strain: Not on file  Food Insecurity: Not on file  Transportation Needs: Not on file  Physical Activity: Not on file  Stress: Not on file  Social Connections: Not on file  Intimate Partner Violence: Not on file    Past Surgical History:  Procedure Laterality Date   APPENDECTOMY      Family History  Problem Relation Age of Onset   Diabetes Maternal Grandmother    Arthritis Maternal Grandmother    Arthritis Maternal Grandfather    Arthritis Paternal Grandmother    Arthritis Paternal Grandfather     No Known Allergies  Current Outpatient Medications on File Prior to Visit  Medication Sig Dispense Refill   aspirin EC 81 MG tablet Take 81 mg by mouth daily. Reported on 07/04/2015     atorvastatin (LIPITOR) 10 MG tablet Take 1 tablet (10 mg total) by mouth daily. 30 tablet 3   atorvastatin (LIPITOR) 10 MG tablet Take one tablet by mouth at bedtime to lower bad cholesterol 90 tablet 3   atorvastatin (LIPITOR) 10 MG tablet Take 1 tablet (10 mg total) by mouth at bedtime to lower bad cholesterol. 90 tablet  3   glucose blood test strip 1 each by Other route 2 (two) times daily.      HUMALOG 100 UNIT/ML injection Inject 10-13 Units into the skin See admin instructions. Per pump and depends on sugar level     HUMALOG MIX 75/25 KWIKPEN (75-25) 100 UNIT/ML Kwikpen INJECT 30 UNITS UNDER THE SKIN BEFORE BREAKFAST AND 22 UNITS BEFORE SUPPER 45 mL 5   Insulin Disposable Pump (OMNIPOD DASH 5 PACK PODS) MISC      Insulin Lispro-aabc (LYUMJEV) 100 UNIT/ML SOLN Use as directed. Maximum daily dose of 100 units. 90 mL 3   Insulin Lispro-aabc (LYUMJEV) 100 UNIT/ML SOLN USE AS DIRECTED. Maximum Daily Dose: 100 units 90 mL 3   Insulin Lispro-aabc (LYUMJEV) 100 UNIT/ML SOLN USE AS DIRECTED.  Maximum daily dose 100 units. 90 mL 3   Insulin Pen Needle 31G X 5 MM MISC Use to inject insulin once daily 100 each 3   levocetirizine (XYZAL) 5 MG tablet Take 1 tablet (5 mg total) by mouth every evening. 30 tablet 3   metFORMIN (GLUCOPHAGE) 500 MG tablet Take 500 mg by mouth daily.      sildenafil (VIAGRA) 100 MG tablet Take 1 tablet (100 mg total) by mouth daily as needed for erectile dysfunction. Max one tab in any 24 hour period. 10 tablet 0   TRULICITY 0.75 MG/0.5ML SOPN Inject 0.75 mg under the skin once a week to lower blood sugars 2 mL 2   TRULICITY 1.5 MG/0.5ML SOPN Inject 1.5 mg into the skin once a week. 6 mL 1   TRULICITY 3 MG/0.5ML SOPN Inject 3 mg under the skin once a week **Stop Trulicty 1.5 mg** 6 mL 1   No current facility-administered medications on file prior to visit.    BP 115/75   Pulse 80   Temp (!) 96.9 F (36.1 C) (Temporal)   Resp 18   Ht 5\' 10"  (1.778 m)   Wt 218 lb 12.8 oz (99.2 kg)   SpO2 97%   BMI 31.39 kg/m        Objective:   Physical Exam  General Mental Status- Alert. General Appearance- Not in acute distress.   Skin General: Color- Normal Color. Moisture- Normal Moisture.  Neck Carotid Arteries- Normal color. Moisture- Normal Moisture. No carotid bruits. No JVD.  Chest and Lung Exam Auscultation: Breath Sounds:-Normal.  Cardiovascular Auscultation:Rythm- Regular. Murmurs & Other Heart Sounds:Auscultation of the heart reveals- No Murmurs.  Abdomen Inspection:-Inspeection Normal. Palpation/Percussion:Note:No mass. Palpation and Percussion of the abdomen reveal- Non Tender, Non Distended + BS, no rebound or guarding.   Neurologic Cranial Nerve exam:- CN III-XII intact(No nystagmus), symmetric smile. Strength:- 5/5 equal and symmetric strength both upper and lower extremities.   Rt shoulder- mild anterior tenderness to palpation.    Assessment & Plan:   Patient Instructions  For you wellness exam today I have ordered cbc,  cmp  and lipid panel. Include psa.  Shingrix vaccine  declined today.  Recommend exercise and healthy diet.  We will let you know lab results as they come in.  Follow up date appointment will be determined after lab review.    Placed referral for colonoscopy.  Refer for diabetic eye exam.   Continue smoking cessation efforts.  For insomnia rx trazadone. Stop alleve pm.  Rt shoulder pain. Will refer to sport med MD. Evaluate and treat.               , Esperanza Richters   [place] charge as did  address insomnia and shoulder pain. Placed referral to sports med.

## 2022-01-13 ENCOUNTER — Telehealth: Payer: 59 | Admitting: Physician Assistant

## 2022-01-13 ENCOUNTER — Encounter: Payer: Self-pay | Admitting: Medical

## 2022-01-13 ENCOUNTER — Other Ambulatory Visit (HOSPITAL_BASED_OUTPATIENT_CLINIC_OR_DEPARTMENT_OTHER): Payer: Self-pay

## 2022-01-13 DIAGNOSIS — J111 Influenza due to unidentified influenza virus with other respiratory manifestations: Secondary | ICD-10-CM | POA: Diagnosis not present

## 2022-01-13 MED ORDER — PROMETHAZINE-DM 6.25-15 MG/5ML PO SYRP
5.0000 mL | ORAL_SOLUTION | Freq: Four times a day (QID) | ORAL | 0 refills | Status: AC | PRN
  Filled 2022-01-13: qty 118, 6d supply, fill #0

## 2022-01-13 MED ORDER — BENZONATATE 100 MG PO CAPS
100.0000 mg | ORAL_CAPSULE | Freq: Three times a day (TID) | ORAL | 0 refills | Status: DC | PRN
  Filled 2022-01-13: qty 30, 10d supply, fill #0

## 2022-01-13 MED ORDER — OSELTAMIVIR PHOSPHATE 75 MG PO CAPS
75.0000 mg | ORAL_CAPSULE | Freq: Two times a day (BID) | ORAL | 0 refills | Status: AC
  Filled 2022-01-13: qty 10, 5d supply, fill #0

## 2022-01-13 NOTE — Patient Instructions (Signed)
Alvin Critchley Sarabia, thank you for joining Margaretann Loveless, PA-C for today's virtual visit.  While this provider is not your primary care provider (PCP), if your PCP is located in our provider database this encounter information will be shared with them immediately following your visit.  Consent: (Patient) Courtez Twaddle Fedorko provided verbal consent for this virtual visit at the beginning of the encounter.  Current Medications:  Current Outpatient Medications:    benzonatate (TESSALON) 100 MG capsule, Take 1 capsule (100 mg total) by mouth 3 (three) times daily as needed., Disp: 30 capsule, Rfl: 0   oseltamivir (TAMIFLU) 75 MG capsule, Take 1 capsule (75 mg total) by mouth 2 (two) times daily., Disp: 10 capsule, Rfl: 0   promethazine-dextromethorphan (PROMETHAZINE-DM) 6.25-15 MG/5ML syrup, Take 5 mLs by mouth 4 (four) times daily as needed., Disp: 118 mL, Rfl: 0   aspirin EC 81 MG tablet, Take 81 mg by mouth daily. Reported on 07/04/2015, Disp: , Rfl:    atorvastatin (LIPITOR) 10 MG tablet, Take 1 tablet (10 mg total) by mouth daily., Disp: 30 tablet, Rfl: 3   atorvastatin (LIPITOR) 10 MG tablet, Take one tablet by mouth at bedtime to lower bad cholesterol, Disp: 90 tablet, Rfl: 3   atorvastatin (LIPITOR) 10 MG tablet, Take 1 tablet (10 mg total) by mouth at bedtime to lower bad cholesterol., Disp: 90 tablet, Rfl: 3   glucose blood test strip, 1 each by Other route 2 (two) times daily. , Disp: , Rfl:    HUMALOG 100 UNIT/ML injection, Inject 10-13 Units into the skin See admin instructions. Per pump and depends on sugar level, Disp: , Rfl:    HUMALOG MIX 75/25 KWIKPEN (75-25) 100 UNIT/ML Kwikpen, INJECT 30 UNITS UNDER THE SKIN BEFORE BREAKFAST AND 22 UNITS BEFORE SUPPER, Disp: 45 mL, Rfl: 5   Insulin Disposable Pump (OMNIPOD DASH 5 PACK PODS) MISC, , Disp: , Rfl:    Insulin Lispro-aabc (LYUMJEV) 100 UNIT/ML SOLN, Use as directed. Maximum daily dose of 100 units., Disp: 90 mL, Rfl: 3   Insulin  Lispro-aabc (LYUMJEV) 100 UNIT/ML SOLN, USE AS DIRECTED. Maximum Daily Dose: 100 units, Disp: 90 mL, Rfl: 3   Insulin Lispro-aabc (LYUMJEV) 100 UNIT/ML SOLN, USE AS DIRECTED. Maximum daily dose 100 units., Disp: 90 mL, Rfl: 3   Insulin Pen Needle 31G X 5 MM MISC, Use to inject insulin once daily, Disp: 100 each, Rfl: 3   levocetirizine (XYZAL) 5 MG tablet, Take 1 tablet (5 mg total) by mouth every evening., Disp: 30 tablet, Rfl: 3   metFORMIN (GLUCOPHAGE) 500 MG tablet, Take 500 mg by mouth daily. , Disp: , Rfl:    sildenafil (VIAGRA) 100 MG tablet, Take 1 tablet (100 mg total) by mouth daily as needed for erectile dysfunction. Max one tab in any 24 hour period., Disp: 10 tablet, Rfl: 0   traZODone (DESYREL) 50 MG tablet, Take 1/2-1 tablets (25-50 mg total) by mouth at bedtime as needed for sleep., Disp: 30 tablet, Rfl: 3   TRULICITY 0.75 MG/0.5ML SOPN, Inject 0.75 mg under the skin once a week to lower blood sugars, Disp: 2 mL, Rfl: 2   TRULICITY 1.5 MG/0.5ML SOPN, Inject 1.5 mg into the skin once a week., Disp: 6 mL, Rfl: 1   TRULICITY 3 MG/0.5ML SOPN, Inject 3 mg under the skin once a week **Stop Trulicty 1.5 mg**, Disp: 6 mL, Rfl: 1   Medications ordered in this encounter:  Meds ordered this encounter  Medications   oseltamivir (TAMIFLU) 75  MG capsule    Sig: Take 1 capsule (75 mg total) by mouth 2 (two) times daily.    Dispense:  10 capsule    Refill:  0    Order Specific Question:   Supervising Provider    Answer:   MILLER, BRIAN [[id number]]   benzonatate (TESSALON) 100 MG capsule    Sig: Take 1 capsule (100 mg total) by mouth 3 (three) times daily as needed.    Dispense:  30 capsule    Refill:  0    Order Specific Question:   Supervising Provider    Answer:   Eber Hong [[id number]]   promethazine-dextromethorphan (PROMETHAZINE-DM) 6.25-15 MG/5ML syrup    Sig: Take 5 mLs by mouth 4 (four) times daily as needed.    Dispense:  118 mL    Refill:  0    Order Specific Question:   Supervising  Provider    Answer:   Hyacinth Meeker, BRIAN [[id number]]     *If you need refills on other medications prior to your next appointment, please contact your pharmacy*  Follow-Up: Call back or seek an in-person evaluation if the symptoms worsen or if the condition fails to improve as anticipated.  Other Instructions Influenza, Adult Influenza, also called "the flu," is a viral infection that mainly affects the respiratory tract. This includes the lungs, nose, and throat. The flu spreads easily from person to person (is contagious). It causes common cold symptoms, along with high fever and body aches. What are the causes? This condition is caused by the influenza virus. You can get the virus by: Breathing in droplets that are in the air from an infected person's cough or sneeze. Touching something that has the virus on it (has been contaminated) and then touching your mouth, nose, or eyes. What increases the risk? The following factors may make you more likely to get the flu: Not washing or sanitizing your hands often. Having close contact with many people during cold and flu season. Touching your mouth, eyes, or nose without first washing or sanitizing your hands. Not getting an annual flu shot. You may have a higher risk for the flu, including serious problems, such as a lung infection (pneumonia), if you: Are older than 65. Are pregnant. Have a weakened disease-fighting system (immune system). This includes people who have HIV or AIDS, are on chemotherapy, or are taking medicines that reduce (suppress) the immune system. Have a long-term (chronic) illness, such as heart disease, kidney disease, diabetes, or lung disease. Have a liver disorder. Are severely overweight (morbidly obese). Have anemia. Have asthma. What are the signs or symptoms? Symptoms of this condition usually begin suddenly and last 4-14 days. These may include: Fever and chills. Headaches, body aches, or muscle aches. Sore  throat. Cough. Runny or stuffy (congested) nose. Chest discomfort. Poor appetite. Weakness or fatigue. Dizziness. Nausea or vomiting. How is this diagnosed? This condition may be diagnosed based on: Your symptoms and medical history. A physical exam. Swabbing your nose or throat and testing the fluid for the influenza virus. How is this treated? If the flu is diagnosed early, you can be treated with antiviral medicine that is given by mouth (orally) or through an IV. This can help reduce how severe the illness is and how long it lasts. Taking care of yourself at home can help relieve symptoms. Your health care provider may recommend: Taking over-the-counter medicines. Drinking plenty of fluids. In many cases, the flu goes away on its own. If you have severe symptoms  or complications, you may be treated in a hospital. Follow these instructions at home: Activity Rest as needed and get plenty of sleep. Stay home from work or school as told by your health care provider. Unless you are visiting your health care provider, avoid leaving home until your fever has been gone for 24 hours without taking medicine. Eating and drinking Take an oral rehydration solution (ORS). This is a drink that is sold at pharmacies and retail stores. Drink enough fluid to keep your urine pale yellow. Drink clear fluids in small amounts as you are able. Clear fluids include water, ice chips, fruit juice mixed with water, and low-calorie sports drinks. Eat bland, easy-to-digest foods in small amounts as you are able. These foods include bananas, applesauce, rice, lean meats, toast, and crackers. Avoid drinking fluids that contain a lot of sugar or caffeine, such as energy drinks, regular sports drinks, and soda. Avoid alcohol. Avoid spicy or fatty foods. General instructions     Take over-the-counter and prescription medicines only as told by your health care provider. Use a cool mist humidifier to add  humidity to the air in your home. This can make it easier to breathe. When using a cool mist humidifier, clean it daily. Empty the water and replace it with clean water. Cover your mouth and nose when you cough or sneeze. Wash your hands with soap and water often and for at least 20 seconds, especially after you cough or sneeze. If soap and water are not available, use alcohol-based hand sanitizer. Keep all follow-up visits. This is important. How is this prevented?  Get an annual flu shot. This is usually available in late summer, fall, or winter. Ask your health care provider when you should get your flu shot. Avoid contact with people who are sick during cold and flu season. This is generally fall and winter. Contact a health care provider if: You develop new symptoms. You have: Chest pain. Diarrhea. A fever. Your cough gets worse. You produce more mucus. You feel nauseous or you vomit. Get help right away if you: Develop shortness of breath or have difficulty breathing. Have skin or nails that turn a bluish color. Have severe pain or stiffness in your neck. Develop a sudden headache or sudden pain in your face or ear. Cannot eat or drink without vomiting. These symptoms may represent a serious problem that is an emergency. Do not wait to see if the symptoms will go away. Get medical help right away. Call your local emergency services (911 in the [place] Do not drive yourself to the hospital. Summary Influenza, also called "the flu," is a viral infection that primarily affects your respiratory tract. Symptoms of the flu usually begin suddenly and last 4-14 days. Getting an annual flu shot is the best way to prevent getting the flu. Stay home from work or school as told by your health care provider. Unless you are visiting your health care provider, avoid leaving home until your fever has been gone for 24 hours without taking medicine. Keep all follow-up visits. This is  important. This information is not intended to replace advice given to you by your health care provider. Make sure you discuss any questions you have with your health care provider. Document Revised: 01/13/2020 Document Reviewed: 01/13/2020 Elsevier Patient Education  2023 [employer]    If you have been instructed to have an in-person evaluation today at a local Urgent Care facility, please use the link below. It will take you to  a list of all of our available [facility], including address, phone number and hours of operation. Please do not delay care.  [facility]  If you or a family member do not have a primary care provider, use the link below to schedule a visit and establish care. When you choose a [facility] primary care physician or advanced practice provider, you gain a long-term partner in health. Find a Primary Care Provider  Learn more about [place]'s in-office and virtual care options: [facility]

## 2022-01-13 NOTE — Progress Notes (Signed)
Virtual Visit Consent   [name], you are scheduled for a virtual visit with a [facility] provider today. Just as with appointments in the office, your consent must be obtained to participate. Your consent will be active for this visit and any virtual visit you may have with one of our providers in the next 365 days. If you have a MyChart account, a copy of this consent can be sent to you electronically.  As this is a virtual visit, video technology does not allow for your provider to perform a traditional examination. This may limit your provider's ability to fully assess your condition. If your provider identifies any concerns that need to be evaluated in person or the need to arrange testing (such as labs, EKG, etc.), we will make arrangements to do so. Although advances in technology are sophisticated, we cannot ensure that it will always work on either your end or our end. If the connection with a video visit is poor, the visit may have to be switched to a telephone visit. With either a video or telephone visit, we are not always able to ensure that we have a secure connection.  By engaging in this virtual visit, you consent to the provision of healthcare and authorize for your insurance to be billed (if applicable) for the services provided during this visit. Depending on your insurance coverage, you may receive a charge related to this service.  I need to obtain your verbal consent now. Are you willing to proceed with your visit today? [name] has provided verbal consent on 01/13/2022 for a virtual visit (video or telephone). Margaretann Loveless, PA-C  Date: 01/13/2022 3:37 PM  Virtual Visit via Video Note   I, Margaretann Loveless, connected with  [name]  ([id number], 1965-11-05) on 01/13/22 at  3:30 PM EDT by a video-enabled telemedicine application and verified that I am speaking with the correct person using two identifiers.  Location: Patient: Virtual  Visit Location Patient: Home Provider: Virtual Visit Location Provider: Home Office   I discussed the limitations of evaluation and management by telemedicine and the availability of in person appointments. The patient expressed understanding and agreed to proceed.    History of Present Illness: [name] is a 56 y.o. who identifies as a male who was assigned male at birth, and is being seen today for cough.  HPI: Cough This is a new problem. The current episode started yesterday. The problem has been gradually worsening. The problem occurs constantly. The cough is Non-productive. Associated symptoms include chills, myalgias, nasal congestion, postnasal drip and a sore throat. Pertinent negatives include no fever, headaches or rhinorrhea. Associated symptoms comments: Dry heaving with coughing. The symptoms are aggravated by lying down. Risk factors for lung disease include smoking/tobacco exposure. Treatments tried: theraflu. The treatment provided no relief. His past medical history is significant for bronchitis, COPD and environmental allergies. There is no history of asthma or pneumonia.      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   Avitaminosis D 12/04/2014   Smokes tobacco daily 11/09/2014   Erectile dysfunction associated with type 2 diabetes mellitus (HCC) 11/09/2014   Acquired polycythemia 11/09/2014   Diabetes mellitus, type 2 (  HCC) 11/01/2014   Diabetes (HCC) 10/30/2014    Allergies: No Known Allergies Medications:  Current Outpatient Medications:    benzonatate (TESSALON) 100 MG capsule, Take 1 capsule (100 mg total) by mouth 3 (three) times daily as needed.,  Disp: 30 capsule, Rfl: 0   oseltamivir (TAMIFLU) 75 MG capsule, Take 1 capsule (75 mg total) by mouth 2 (two) times daily., Disp: 10 capsule, Rfl: 0   promethazine-dextromethorphan (PROMETHAZINE-DM) 6.25-15 MG/5ML syrup, Take 5 mLs by mouth 4 (four) times daily as needed., Disp: 118 mL, Rfl: 0   aspirin EC 81 MG tablet, Take 81 mg by mouth daily. Reported on 07/04/2015, Disp: , Rfl:    atorvastatin (LIPITOR) 10 MG tablet, Take 1 tablet (10 mg total) by mouth daily., Disp: 30 tablet, Rfl: 3   atorvastatin (LIPITOR) 10 MG tablet, Take one tablet by mouth at bedtime to lower bad cholesterol, Disp: 90 tablet, Rfl: 3   atorvastatin (LIPITOR) 10 MG tablet, Take 1 tablet (10 mg total) by mouth at bedtime to lower bad cholesterol., Disp: 90 tablet, Rfl: 3   glucose blood test strip, 1 each by Other route 2 (two) times daily. , Disp: , Rfl:    HUMALOG 100 UNIT/ML injection, Inject 10-13 Units into the skin See admin instructions. Per pump and depends on sugar level, Disp: , Rfl:    HUMALOG MIX 75/25 KWIKPEN (75-25) 100 UNIT/ML Kwikpen, INJECT 30 UNITS UNDER THE SKIN BEFORE BREAKFAST AND 22 UNITS BEFORE SUPPER, Disp: 45 mL, Rfl: 5   Insulin Disposable Pump (OMNIPOD DASH 5 PACK PODS) MISC, , Disp: , Rfl:    Insulin Lispro-aabc (LYUMJEV) 100 UNIT/ML SOLN, Use as directed. Maximum daily dose of 100 units., Disp: 90 mL, Rfl: 3   Insulin Lispro-aabc (LYUMJEV) 100 UNIT/ML SOLN, USE AS DIRECTED. Maximum Daily Dose: 100 units, Disp: 90 mL, Rfl: 3   Insulin Lispro-aabc (LYUMJEV) 100 UNIT/ML SOLN, USE AS DIRECTED. Maximum daily dose 100 units., Disp: 90 mL, Rfl: 3   Insulin Pen Needle 31G X 5 MM MISC, Use to inject insulin once daily, Disp: 100 each, Rfl: 3   levocetirizine (XYZAL) 5 MG tablet, Take 1 tablet (5 mg total) by mouth every evening., Disp: 30 tablet, Rfl: 3   metFORMIN (GLUCOPHAGE) 500 MG tablet, Take 500 mg by mouth daily. , Disp: , Rfl:    sildenafil (VIAGRA) 100 MG tablet, Take 1 tablet (100 mg total) by  mouth daily as needed for erectile dysfunction. Max one tab in any 24 hour period., Disp: 10 tablet, Rfl: 0   traZODone (DESYREL) 50 MG tablet, Take 1/2-1 tablets (25-50 mg total) by mouth at bedtime as needed for sleep., Disp: 30 tablet, Rfl: 3   TRULICITY 0.75 MG/0.5ML SOPN, Inject 0.75 mg under the skin once a week to lower blood sugars, Disp: 2 mL, Rfl: 2   TRULICITY 1.5 MG/0.5ML SOPN, Inject 1.5 mg into the skin once a week., Disp: 6 mL, Rfl: 1   TRULICITY 3 MG/0.5ML SOPN, Inject 3 mg under the skin once a week **Stop Trulicty 1.5 mg**, Disp: 6 mL, Rfl: 1  Observations/Objective: Patient is well-developed, well-nourished in no acute distress.  Resting comfortably at home.  Head is normocephalic, atraumatic.  No labored breathing.  Speech is clear and coherent with logical content.  Patient is alert and oriented at baseline.    Assessment and Plan: 1. Influenza - oseltamivir (TAMIFLU) 75 MG capsule; Take 1 capsule (75 mg total) by mouth 2 (two) times daily.  Dispense: 10 capsule; Refill:  0 - benzonatate (TESSALON) 100 MG capsule; Take 1 capsule (100 mg total) by mouth 3 (three) times daily as needed.  Dispense: 30 capsule; Refill: 0 - promethazine-dextromethorphan (PROMETHAZINE-DM) 6.25-15 MG/5ML syrup; Take 5 mLs by mouth 4 (four) times daily as needed.  Dispense: 118 mL; Refill: 0  - Suspected viral URI with negative covid testing - Possible flu as cases of summer influenza have started to show in the community - Limited testing availability that would delay appropriate treatment waiting on results - Tamiflu prescribed for possible flu - Promethazine DM and Tessalon for cough - Work note provided - Push fluids - Symptomatic management OTC of choice as needed - Seek in person evaluation if symptoms worsen or fail to improve   Follow Up Instructions: I discussed the assessment and treatment plan with the patient. The patient was provided an opportunity to ask questions and all  were answered. The patient agreed with the plan and demonstrated an understanding of the instructions.  A copy of instructions were sent to the patient via MyChart unless otherwise noted below.    The patient was advised to call back or seek an in-person evaluation if the symptoms worsen or if the condition fails to improve as anticipated.  Time:  I spent 10 minutes with the patient via telehealth technology discussing the above problems/concerns.    Margaretann Loveless, PA-C

## 2022-01-15 ENCOUNTER — Encounter: Payer: Self-pay | Admitting: Physician Assistant

## 2022-01-22 ENCOUNTER — Encounter: Payer: Self-pay | Admitting: Medical

## 2022-02-18 DIAGNOSIS — E559 Vitamin D deficiency, unspecified: Secondary | ICD-10-CM | POA: Diagnosis not present

## 2022-02-18 DIAGNOSIS — F17200 Nicotine dependence, unspecified, uncomplicated: Secondary | ICD-10-CM | POA: Diagnosis not present

## 2022-02-18 DIAGNOSIS — E1165 Type 2 diabetes mellitus with hyperglycemia: Secondary | ICD-10-CM | POA: Diagnosis not present

## 2022-02-18 DIAGNOSIS — D751 Secondary polycythemia: Secondary | ICD-10-CM | POA: Diagnosis not present

## 2022-03-19 DIAGNOSIS — E1165 Type 2 diabetes mellitus with hyperglycemia: Secondary | ICD-10-CM | POA: Diagnosis not present

## 2022-03-21 DIAGNOSIS — E1165 Type 2 diabetes mellitus with hyperglycemia: Secondary | ICD-10-CM | POA: Diagnosis not present

## 2022-03-25 ENCOUNTER — Other Ambulatory Visit (HOSPITAL_BASED_OUTPATIENT_CLINIC_OR_DEPARTMENT_OTHER): Payer: Self-pay

## 2022-04-03 DIAGNOSIS — E1165 Type 2 diabetes mellitus with hyperglycemia: Secondary | ICD-10-CM | POA: Diagnosis not present

## 2022-05-02 ENCOUNTER — Other Ambulatory Visit (HOSPITAL_BASED_OUTPATIENT_CLINIC_OR_DEPARTMENT_OTHER): Payer: Self-pay

## 2022-05-04 DIAGNOSIS — E1165 Type 2 diabetes mellitus with hyperglycemia: Secondary | ICD-10-CM | POA: Diagnosis not present

## 2022-05-20 ENCOUNTER — Other Ambulatory Visit (HOSPITAL_COMMUNITY): Payer: Self-pay

## 2022-05-22 ENCOUNTER — Other Ambulatory Visit (HOSPITAL_COMMUNITY): Payer: Self-pay

## 2022-05-26 ENCOUNTER — Other Ambulatory Visit (HOSPITAL_BASED_OUTPATIENT_CLINIC_OR_DEPARTMENT_OTHER): Payer: Self-pay

## 2022-05-26 DIAGNOSIS — D751 Secondary polycythemia: Secondary | ICD-10-CM | POA: Diagnosis not present

## 2022-05-26 DIAGNOSIS — F17200 Nicotine dependence, unspecified, uncomplicated: Secondary | ICD-10-CM | POA: Diagnosis not present

## 2022-05-26 DIAGNOSIS — E559 Vitamin D deficiency, unspecified: Secondary | ICD-10-CM | POA: Diagnosis not present

## 2022-05-26 DIAGNOSIS — E538 Deficiency of other specified B group vitamins: Secondary | ICD-10-CM | POA: Diagnosis not present

## 2022-05-26 DIAGNOSIS — E1165 Type 2 diabetes mellitus with hyperglycemia: Secondary | ICD-10-CM | POA: Diagnosis not present

## 2022-05-26 DIAGNOSIS — E291 Testicular hypofunction: Secondary | ICD-10-CM | POA: Diagnosis not present

## 2022-05-26 MED ORDER — INSULIN LISPRO 100 UNIT/ML IJ SOLN
100.0000 [IU] | Freq: Every day | INTRAMUSCULAR | 3 refills | Status: AC
  Filled 2022-05-26 – 2022-06-11 (×2): qty 90, 90d supply, fill #0
  Filled 2023-05-22 (×2): qty 90, 90d supply, fill #1

## 2022-05-26 MED ORDER — TRULICITY 1.5 MG/0.5ML ~~LOC~~ SOAJ
1.5000 mg | SUBCUTANEOUS | 5 refills | Status: AC
  Filled 2022-05-26 – 2022-06-11 (×2): qty 2, 28d supply, fill #0
  Filled 2023-05-22: qty 2, 28d supply, fill #1

## 2022-05-27 ENCOUNTER — Other Ambulatory Visit (HOSPITAL_BASED_OUTPATIENT_CLINIC_OR_DEPARTMENT_OTHER): Payer: Self-pay

## 2022-06-03 DIAGNOSIS — E1165 Type 2 diabetes mellitus with hyperglycemia: Secondary | ICD-10-CM | POA: Diagnosis not present

## 2022-06-04 ENCOUNTER — Other Ambulatory Visit (HOSPITAL_BASED_OUTPATIENT_CLINIC_OR_DEPARTMENT_OTHER): Payer: Self-pay

## 2022-06-04 ENCOUNTER — Other Ambulatory Visit (HOSPITAL_COMMUNITY): Payer: Self-pay

## 2022-06-11 ENCOUNTER — Ambulatory Visit
Admission: EM | Admit: 2022-06-11 | Discharge: 2022-06-11 | Disposition: A | Discharge status: Home / Self Care | Payer: Commercial Managed Care - PPO | Attending: Family Medicine | Admitting: Family Medicine

## 2022-06-11 ENCOUNTER — Telehealth: Payer: Commercial Managed Care - PPO | Admitting: Physician Assistant

## 2022-06-11 ENCOUNTER — Other Ambulatory Visit (HOSPITAL_BASED_OUTPATIENT_CLINIC_OR_DEPARTMENT_OTHER): Payer: Self-pay

## 2022-06-11 DIAGNOSIS — R197 Diarrhea, unspecified: Secondary | ICD-10-CM | POA: Diagnosis not present

## 2022-06-11 DIAGNOSIS — R110 Nausea: Secondary | ICD-10-CM

## 2022-06-11 MED ORDER — ONDANSETRON 8 MG PO TBDP
8.0000 mg | ORAL_TABLET | Freq: Three times a day (TID) | ORAL | 0 refills | Status: AC | PRN
  Filled 2022-06-11: qty 24, 8d supply, fill #0

## 2022-06-11 NOTE — Discharge Instructions (Addendum)
Advised patient may take Zofran for intermittent nausea daily or as needed.  Encouraged patient to increase daily water intake to 64 ounces per day may also use Gatorade G2 (lower form of sugar Gatorade), and or ginger ale for upset stomach.  Advised if symptoms worsen and/or unresolved please follow-up with PCP or here for further evaluation.

## 2022-06-11 NOTE — ED Triage Notes (Signed)
Pt presents to Urgent Care with c/o diarrhea, nausea, and body aches since yesterday.

## 2022-06-11 NOTE — Progress Notes (Signed)
Based on what you shared with me, specifically, that you are so weak you are having trouble walking and that you feel dizzy or like you could pass out,I feel your condition warrants further evaluation as soon as possible at an Emergency department. You may be severely dehydrated requiring fluids.   NOTE: There will be NO CHARGE for this eVisit   If you are having a true medical emergency please call 911.      Emergency [facility]  Get Driving Directions  [phone]  [address]  [place], [place] [address]  Open 24/7/365      [facility] Emergency Department at [place]  [address]  [place], [place] [address]  Open 24/7/365    Emergency [facility]  Get Driving Directions  [phone]  [address], [place] [address]  Open 24/7/365      [facility]  Get Driving Directions  [phone]  [address]  [place], [place] [address]  Open 24/7/365    [facility]  Emergency [facility]  Get Driving Directions  [phone]  [place], [address] [address]  Open 24/7/365    [facility]  Emergency Department- [place]  Get Driving Directions  [address]  [place], [place] [address]  Open 24/7/365    [facility]  Emergency [facility]  Get Driving Directions  [phone]  [address]  [place], [place] [address]  Open 24/7/365   I have spent 5 minutes in review of e-visit questionnaire, review and updating patient chart, medical decision making and response to patient.   Mar Daring, PA-C

## 2022-06-11 NOTE — ED Provider Notes (Signed)
[name]    CSN: [id number] Arrival date & time: 06/11/22  0950      History   Chief Complaint Chief Complaint  Patient presents with   Diarrhea    HPI [name] is a 57 y.o. male.   HPI 57 year old male presents with diarrhea, nausea and bodyaches since yesterday.  Made significant for CVA, T2DM, and mixed HLD.  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 Authorizing Provider  ondansetron (ZOFRAN-ODT) 8 MG disintegrating tablet Take 1 tablet (8 mg total) by mouth every 8 (eight) hours as needed for nausea or vomiting. 06/11/22  Yes Eliezer Lofts, FNP  aspirin EC 81 MG tablet Take 81 mg by mouth daily. Reported on 07/04/2015    [provider]  atorvastatin (LIPITOR) 10 MG tablet Take 1 tablet (10 mg total) by mouth daily. 01/07/19   Saguier, Percell Miller, PA-C  atorvastatin (LIPITOR) 10 MG tablet Take one tablet by mouth at bedtime to lower bad cholesterol 01/22/21     atorvastatin (LIPITOR) 10 MG tablet Take 1 tablet (10 mg total) by mouth at bedtime to lower bad cholesterol. 08/13/21     benzonatate (TESSALON) 100 MG capsule Take 1 capsule (100  mg total) by mouth 3 (three) times daily as needed. 01/13/22   Mar Daring, PA-C  glucose blood test strip 1 each by Other route 2 (two) times daily.  02/14/15   [provider]  HUMALOG 100 UNIT/ML injection Inject 10-13 Units into the skin See admin instructions. Per pump and depends on sugar level 04/26/19   [provider]  HUMALOG MIX 75/25 KWIKPEN (75-25) 100 UNIT/ML Kwikpen INJECT 30 UNITS UNDER THE SKIN BEFORE BREAKFAST AND 22 UNITS BEFORE SUPPER 10/19/19 10/18/20  Alanson Aly, MD  Insulin Disposable Pump (OMNIPOD DASH 5 PACK PODS) MISC  05/20/19   [provider]  insulin lispro (HUMALOG) 100 UNIT/ML injection Inject 1 mL (100 Units total) into the skin daily. 05/26/22     Insulin Lispro-aabc (LYUMJEV) 100 UNIT/ML SOLN Use as directed. Maximum daily dose of 100 units. 12/03/20     Insulin Lispro-aabc (LYUMJEV) 100 UNIT/ML SOLN USE AS DIRECTED. Maximum Daily Dose: 100 units 05/10/21     Insulin Lispro-aabc (LYUMJEV) 100 UNIT/ML SOLN USE AS DIRECTED. Maximum daily dose 100 units. 08/13/21     Insulin Pen Needle 31G X 5 MM MISC Use to inject insulin once daily 05/24/21     levocetirizine (XYZAL) 5 MG tablet Take 1 tablet (5 mg total) by mouth every evening. 10/05/20   Saguier, Percell Miller, PA-C  metFORMIN (GLUCOPHAGE) 500 MG tablet Take 500 mg by mouth daily.     [provider]  oseltamivir (  TAMIFLU) 75 MG capsule Take 1 capsule (75 mg total) by mouth 2 (two) times daily. 01/13/22   Mar Daring, PA-C  promethazine-dextromethorphan (PROMETHAZINE-DM) 6.25-15 MG/5ML syrup Take 5 mLs by mouth 4 (four) times daily as needed. 01/13/22   Mar Daring, PA-C  sildenafil (VIAGRA) 100 MG tablet Take 1 tablet (100 mg total) by mouth daily as needed for erectile dysfunction. Max one tab in any 24 hour period. 01/03/21   Saguier, Percell Miller, PA-C  traZODone (DESYREL) 50 MG tablet Take 1/2-1 tablets (25-50 mg total) by mouth at bedtime as needed for sleep. 12/27/21    Saguier, Percell Miller, PA-C  TRULICITY 3.40 BT/2.4EL SOPN Inject 0.75 mg under the skin once a week to lower blood sugars 85/90/93     TRULICITY 1.5 JP/2.1KK SOPN Inject 1.5 mg into the skin once a week. 09/11/67     TRULICITY 1.5 FQ/7.2UV SOPN Inject 1.5 mg into the skin once a week. 75/05/18     TRULICITY 3 ZF/5.8IP SOPN Inject 3 mg under the skin once a week **Stop Trulicty 1.5 mg** 1/89/84       Family History Family History  Problem Relation Age of Onset   Lupus Mother    Diabetes Maternal Grandmother    Arthritis Maternal Grandmother    Arthritis Maternal Grandfather    Arthritis Paternal Grandmother    Arthritis Paternal Grandfather     Social History Social History   Tobacco Use   Smoking status: Former    Packs/day: 1.00    Years: 30.00    Total pack years: 30.00    Types: Cigarettes    Start date: 10/29/1984   Smokeless tobacco: Never  Vaping Use   Vaping Use: Some days  Substance Use Topics   Alcohol use: No    Alcohol/week: 0.0 standard drinks of alcohol   Drug use: No     Allergies   Patient has no known allergies.   Review of Systems Review of Systems  Gastrointestinal:  Positive for nausea.  Musculoskeletal:  Positive for myalgias.  All other systems reviewed and are negative.    Physical Exam Triage Vital Signs ED Triage Vitals  Enc Vitals Group     BP 06/11/22 1025 116/82     Pulse Rate 06/11/22 1025 95     Resp 06/11/22 1025 20     Temp 06/11/22 1025 98.4 F (36.9 C)     Temp Source 06/11/22 1025 Oral     SpO2 06/11/22 1025 97 %     Weight 06/11/22 1019 215 lb (97.5 kg)     Height 06/11/22 1019 5\' 10"  (1.778 m)     Head Circumference --      Peak Flow --      Pain Score 06/11/22 1019 0     Pain Loc --      Pain Edu? --      Excl. in [place]? --    No data found.  Updated Vital Signs BP 116/82 (BP Location: Right Arm)   Pulse 95   Temp 98.4 F (36.9 C) (Oral)   Resp 20   Ht 5\' 10"  (1.778 m)   Wt 215 lb (97.5 kg)   SpO2 97%   BMI  30.85 kg/m   Visual Acuity Right Eye Distance:   Left Eye Distance:   Bilateral Distance:    Right Eye Near:   Left Eye Near:    Bilateral Near:     Physical Exam Vitals and nursing note reviewed.  Constitutional:  Appearance: Normal appearance. He is normal weight.  HENT:     Head: Normocephalic and atraumatic.     Right Ear: Tympanic membrane, ear canal and external ear normal.     Left Ear: Tympanic membrane, ear canal and external ear normal.     Mouth/Throat:     Mouth: Mucous membranes are moist.     Pharynx: Oropharynx is clear.  Eyes:     Extraocular Movements: Extraocular movements intact.     Conjunctiva/sclera: Conjunctivae normal.     Pupils: Pupils are equal, round, and reactive to light.  Cardiovascular:     Rate and Rhythm: Normal rate and regular rhythm.     Pulses: Normal pulses.     Heart sounds: Normal heart sounds.  Pulmonary:     Effort: Pulmonary effort is normal.     Breath sounds: Normal breath sounds. No wheezing, rhonchi or rales.  Musculoskeletal:        General: Normal range of motion.     Cervical back: Normal range of motion and neck supple.  Skin:    General: Skin is warm and dry.  Neurological:     General: No focal deficit present.     Mental Status: He is alert and oriented to person, place, and time.      UC Treatments / Results  Labs (all labs ordered are listed, but only abnormal results are displayed) Labs Reviewed - No data to display  EKG   Radiology No results found.  Procedures Procedures (including critical care time)  Medications Ordered in UC Medications - No data to display  Initial Impression / Assessment and Plan / UC Course  I have reviewed the triage vital signs and the nursing notes.  Pertinent labs & imaging results that were available during my care of the patient were reviewed by me and considered in my medical decision making (see chart for details).     MDM: 1.  Diarrhea, unspecified  type-advised conservative measures for now increase hydration with food choices provided to patient in AVS; 2.  Nausea-Rx'd Zofran. Advised patient may take Zofran for intermittent nausea daily or as needed.  Encouraged patient to increase daily water intake to 64 ounces per day may also use Gatorade G2 (lower form of sugar Gatorade), and or ginger ale for upset stomach.  Advised if symptoms worsen and/or unresolved please follow-up with PCP or here for further evaluation.  Work note provided to patient prior to discharge.  Patient discharged home, hemodynamically stable. Final Clinical Impressions(s) / UC Diagnoses   Final diagnoses:  Diarrhea, unspecified type  Nausea     Discharge Instructions      Advised patient may take Zofran for intermittent nausea daily or as needed.  Encouraged patient to increase daily water intake to 64 ounces per day may also use Gatorade G2 (lower form of sugar Gatorade), and or ginger ale for upset stomach.  Advised if symptoms worsen and/or unresolved please follow-up with PCP or here for further evaluation.     ED Prescriptions     Medication Sig Dispense Auth. Provider   ondansetron (ZOFRAN-ODT) 8 MG disintegrating tablet Take 1 tablet (8 mg total) by mouth every 8 (eight) hours as needed for nausea or vomiting. 24 tablet Trevor Iha, FNP      PDMP not reviewed this encounter.   Trevor Iha, FNP 06/11/22 1132

## 2022-08-26 DIAGNOSIS — E1165 Type 2 diabetes mellitus with hyperglycemia: Secondary | ICD-10-CM | POA: Diagnosis not present

## 2022-08-26 DIAGNOSIS — D751 Secondary polycythemia: Secondary | ICD-10-CM | POA: Diagnosis not present

## 2022-08-26 DIAGNOSIS — E291 Testicular hypofunction: Secondary | ICD-10-CM | POA: Diagnosis not present

## 2022-08-26 DIAGNOSIS — E559 Vitamin D deficiency, unspecified: Secondary | ICD-10-CM | POA: Diagnosis not present

## 2022-08-28 ENCOUNTER — Other Ambulatory Visit (HOSPITAL_BASED_OUTPATIENT_CLINIC_OR_DEPARTMENT_OTHER): Payer: Self-pay

## 2022-08-28 MED ORDER — INSULIN LISPRO 100 UNIT/ML IJ SOLN
INTRAMUSCULAR | 3 refills | Status: AC
  Filled 2022-08-28: qty 90, 90d supply, fill #0

## 2022-08-28 MED ORDER — FREESTYLE LIBRE 3 SENSOR MISC
5 refills | Status: AC
  Filled 2022-08-28: qty 2, 28d supply, fill #0
  Filled 2022-09-18 – 2022-09-19 (×2): qty 2, 28d supply, fill #1
  Filled 2022-10-14: qty 2, 28d supply, fill #2
  Filled 2022-11-12 – 2022-11-24 (×2): qty 2, 28d supply, fill #3

## 2022-08-28 MED ORDER — OMNIPOD DASH PODS (GEN 4) MISC
5 refills | Status: AC
  Filled 2022-08-28 – 2023-02-13 (×3): qty 15, 30d supply, fill #0

## 2022-09-01 ENCOUNTER — Other Ambulatory Visit (HOSPITAL_BASED_OUTPATIENT_CLINIC_OR_DEPARTMENT_OTHER): Payer: Self-pay

## 2022-09-04 ENCOUNTER — Other Ambulatory Visit (HOSPITAL_BASED_OUTPATIENT_CLINIC_OR_DEPARTMENT_OTHER): Payer: Self-pay

## 2022-09-09 ENCOUNTER — Other Ambulatory Visit (HOSPITAL_BASED_OUTPATIENT_CLINIC_OR_DEPARTMENT_OTHER): Payer: Self-pay

## 2022-09-09 MED ORDER — OMNIPOD 5 DEXG7G6 INTRO GEN 5 KIT
PACK | 0 refills | Status: AC
  Filled 2022-09-09: qty 1, 20d supply, fill #0

## 2022-09-09 MED ORDER — OMNIPOD 5 DEXG7G6 PODS GEN 5 MISC
3 refills | Status: AC
  Filled 2022-10-14: qty 15, 30d supply, fill #0
  Filled 2022-11-27: qty 15, 30d supply, fill #1
  Filled 2023-01-15: qty 45, 90d supply, fill #2

## 2022-09-09 MED ORDER — OMNIPOD DASH INTRO (GEN 4) KIT
PACK | 0 refills | Status: AC
  Filled 2022-09-09: qty 1, 30d supply, fill #0

## 2022-09-10 ENCOUNTER — Other Ambulatory Visit (HOSPITAL_BASED_OUTPATIENT_CLINIC_OR_DEPARTMENT_OTHER): Payer: Self-pay

## 2022-09-12 ENCOUNTER — Other Ambulatory Visit (HOSPITAL_BASED_OUTPATIENT_CLINIC_OR_DEPARTMENT_OTHER): Payer: Self-pay

## 2022-09-18 ENCOUNTER — Other Ambulatory Visit: Payer: Self-pay

## 2022-09-19 ENCOUNTER — Other Ambulatory Visit: Payer: Self-pay

## 2022-09-22 ENCOUNTER — Encounter: Payer: Self-pay | Admitting: *Deleted

## 2022-09-24 ENCOUNTER — Other Ambulatory Visit: Payer: Self-pay

## 2022-09-26 ENCOUNTER — Other Ambulatory Visit (HOSPITAL_BASED_OUTPATIENT_CLINIC_OR_DEPARTMENT_OTHER): Payer: Self-pay

## 2022-10-07 ENCOUNTER — Other Ambulatory Visit (HOSPITAL_BASED_OUTPATIENT_CLINIC_OR_DEPARTMENT_OTHER): Payer: Self-pay

## 2022-10-08 ENCOUNTER — Other Ambulatory Visit (HOSPITAL_BASED_OUTPATIENT_CLINIC_OR_DEPARTMENT_OTHER): Payer: Self-pay

## 2022-10-08 ENCOUNTER — Encounter (HOSPITAL_BASED_OUTPATIENT_CLINIC_OR_DEPARTMENT_OTHER): Payer: Self-pay

## 2022-10-08 MED ORDER — FREESTYLE PRECISION NEO TEST VI STRP
ORAL_STRIP | 11 refills | Status: AC
  Filled 2022-10-14: qty 50, 16d supply, fill #0

## 2022-10-12 ENCOUNTER — Other Ambulatory Visit: Payer: Self-pay | Admitting: Acute Care

## 2022-10-12 DIAGNOSIS — F17210 Nicotine dependence, cigarettes, uncomplicated: Secondary | ICD-10-CM

## 2022-10-12 DIAGNOSIS — Z122 Encounter for screening for malignant neoplasm of respiratory organs: Secondary | ICD-10-CM

## 2022-10-12 DIAGNOSIS — Z87891 Personal history of nicotine dependence: Secondary | ICD-10-CM

## 2022-10-14 ENCOUNTER — Other Ambulatory Visit (HOSPITAL_BASED_OUTPATIENT_CLINIC_OR_DEPARTMENT_OTHER): Payer: Self-pay

## 2022-10-15 ENCOUNTER — Other Ambulatory Visit (HOSPITAL_BASED_OUTPATIENT_CLINIC_OR_DEPARTMENT_OTHER): Payer: Self-pay

## 2022-10-24 ENCOUNTER — Other Ambulatory Visit (HOSPITAL_BASED_OUTPATIENT_CLINIC_OR_DEPARTMENT_OTHER): Payer: Self-pay

## 2022-10-30 ENCOUNTER — Other Ambulatory Visit (HOSPITAL_COMMUNITY): Payer: Self-pay

## 2022-10-30 ENCOUNTER — Other Ambulatory Visit (HOSPITAL_BASED_OUTPATIENT_CLINIC_OR_DEPARTMENT_OTHER): Payer: Self-pay

## 2022-10-30 MED ORDER — SILDENAFIL CITRATE 100 MG PO TABS
100.0000 mg | ORAL_TABLET | Freq: Every day | ORAL | 3 refills | Status: AC | PRN
  Filled 2022-10-30: qty 10, 10d supply, fill #0

## 2022-11-12 ENCOUNTER — Other Ambulatory Visit (HOSPITAL_BASED_OUTPATIENT_CLINIC_OR_DEPARTMENT_OTHER): Payer: Self-pay

## 2022-11-24 ENCOUNTER — Other Ambulatory Visit (HOSPITAL_BASED_OUTPATIENT_CLINIC_OR_DEPARTMENT_OTHER): Payer: Self-pay

## 2022-11-24 MED ORDER — FREESTYLE LIBRE 3 SENSOR MISC
3 refills | Status: AC
  Filled 2022-11-24 (×2): qty 6, 84d supply, fill #0
  Filled 2023-02-13: qty 3, 24d supply, fill #1

## 2022-11-27 ENCOUNTER — Other Ambulatory Visit (HOSPITAL_BASED_OUTPATIENT_CLINIC_OR_DEPARTMENT_OTHER): Payer: Self-pay

## 2022-11-28 ENCOUNTER — Other Ambulatory Visit (HOSPITAL_BASED_OUTPATIENT_CLINIC_OR_DEPARTMENT_OTHER): Payer: Self-pay

## 2022-12-01 ENCOUNTER — Ambulatory Visit (INDEPENDENT_AMBULATORY_CARE_PROVIDER_SITE_OTHER): Payer: Managed Care, Other (non HMO)

## 2022-12-01 DIAGNOSIS — Z87891 Personal history of nicotine dependence: Secondary | ICD-10-CM

## 2022-12-01 DIAGNOSIS — Z122 Encounter for screening for malignant neoplasm of respiratory organs: Secondary | ICD-10-CM | POA: Diagnosis not present

## 2022-12-01 DIAGNOSIS — F17210 Nicotine dependence, cigarettes, uncomplicated: Secondary | ICD-10-CM

## 2022-12-05 ENCOUNTER — Other Ambulatory Visit: Payer: Self-pay | Admitting: Acute Care

## 2022-12-05 DIAGNOSIS — Z122 Encounter for screening for malignant neoplasm of respiratory organs: Secondary | ICD-10-CM

## 2022-12-05 DIAGNOSIS — Z87891 Personal history of nicotine dependence: Secondary | ICD-10-CM

## 2022-12-26 ENCOUNTER — Other Ambulatory Visit (HOSPITAL_COMMUNITY): Payer: Self-pay

## 2023-01-15 ENCOUNTER — Other Ambulatory Visit (HOSPITAL_BASED_OUTPATIENT_CLINIC_OR_DEPARTMENT_OTHER): Payer: Self-pay

## 2023-01-16 ENCOUNTER — Other Ambulatory Visit (HOSPITAL_BASED_OUTPATIENT_CLINIC_OR_DEPARTMENT_OTHER): Payer: Self-pay

## 2023-01-19 ENCOUNTER — Other Ambulatory Visit (HOSPITAL_BASED_OUTPATIENT_CLINIC_OR_DEPARTMENT_OTHER): Payer: Self-pay

## 2023-01-22 ENCOUNTER — Other Ambulatory Visit (HOSPITAL_BASED_OUTPATIENT_CLINIC_OR_DEPARTMENT_OTHER): Payer: Self-pay

## 2023-02-02 ENCOUNTER — Other Ambulatory Visit (HOSPITAL_BASED_OUTPATIENT_CLINIC_OR_DEPARTMENT_OTHER): Payer: Self-pay

## 2023-02-13 ENCOUNTER — Other Ambulatory Visit (HOSPITAL_BASED_OUTPATIENT_CLINIC_OR_DEPARTMENT_OTHER): Payer: Self-pay

## 2023-02-20 ENCOUNTER — Other Ambulatory Visit (HOSPITAL_COMMUNITY): Payer: Self-pay

## 2023-03-04 DIAGNOSIS — D751 Secondary polycythemia: Secondary | ICD-10-CM | POA: Diagnosis not present

## 2023-03-04 DIAGNOSIS — E785 Hyperlipidemia, unspecified: Secondary | ICD-10-CM | POA: Diagnosis not present

## 2023-03-04 DIAGNOSIS — E559 Vitamin D deficiency, unspecified: Secondary | ICD-10-CM | POA: Diagnosis not present

## 2023-03-04 DIAGNOSIS — E1165 Type 2 diabetes mellitus with hyperglycemia: Secondary | ICD-10-CM | POA: Diagnosis not present

## 2023-03-04 DIAGNOSIS — E291 Testicular hypofunction: Secondary | ICD-10-CM | POA: Diagnosis not present

## 2023-03-11 ENCOUNTER — Other Ambulatory Visit (HOSPITAL_BASED_OUTPATIENT_CLINIC_OR_DEPARTMENT_OTHER): Payer: Self-pay

## 2023-05-22 ENCOUNTER — Other Ambulatory Visit (HOSPITAL_BASED_OUTPATIENT_CLINIC_OR_DEPARTMENT_OTHER): Payer: Self-pay

## 2023-05-22 ENCOUNTER — Other Ambulatory Visit: Payer: Self-pay

## 2023-05-22 ENCOUNTER — Other Ambulatory Visit (HOSPITAL_COMMUNITY): Payer: Self-pay

## 2023-05-22 MED ORDER — LANTUS SOLOSTAR 100 UNIT/ML ~~LOC~~ SOPN
50.0000 [IU] | PEN_INJECTOR | Freq: Every day | SUBCUTANEOUS | 3 refills | Status: AC
  Filled 2023-05-22: qty 45, 90d supply, fill #0
  Filled 2023-09-29: qty 45, 90d supply, fill #1

## 2023-05-22 MED ORDER — HUMALOG KWIKPEN 200 UNIT/ML ~~LOC~~ SOPN
10.0000 [IU] | PEN_INJECTOR | Freq: Three times a day (TID) | SUBCUTANEOUS | 3 refills | Status: AC
  Filled 2023-05-22: qty 24, 80d supply, fill #0
  Filled 2023-09-29: qty 15, 50d supply, fill #1
  Filled 2023-11-23: qty 9, 30d supply, fill #2
  Filled 2023-11-23 – 2024-01-14 (×2): qty 15, 50d supply, fill #2
  Filled 2024-05-13: qty 15, 50d supply, fill #3

## 2023-05-22 MED ORDER — INSULIN LISPRO 100 UNIT/ML IJ SOLN
10.0000 [IU] | Freq: Three times a day (TID) | INTRAMUSCULAR | 3 refills | Status: AC
  Filled 2023-05-22: qty 50, 84d supply, fill #0

## 2023-05-22 MED ORDER — INSULIN PEN NEEDLE 31G X 5 MM MISC
3 refills | Status: AC
  Filled 2023-05-22: qty 300, 75d supply, fill #0
  Filled 2024-01-15: qty 100, 25d supply, fill #1
  Filled 2024-03-23: qty 400, 90d supply, fill #2
  Filled 2024-05-13: qty 100, 30d supply, fill #2

## 2023-05-25 ENCOUNTER — Other Ambulatory Visit (HOSPITAL_BASED_OUTPATIENT_CLINIC_OR_DEPARTMENT_OTHER): Payer: Self-pay

## 2023-06-02 DIAGNOSIS — D751 Secondary polycythemia: Secondary | ICD-10-CM | POA: Diagnosis not present

## 2023-06-02 DIAGNOSIS — E559 Vitamin D deficiency, unspecified: Secondary | ICD-10-CM | POA: Diagnosis not present

## 2023-06-02 DIAGNOSIS — E291 Testicular hypofunction: Secondary | ICD-10-CM | POA: Diagnosis not present

## 2023-06-02 DIAGNOSIS — E1165 Type 2 diabetes mellitus with hyperglycemia: Secondary | ICD-10-CM | POA: Diagnosis not present

## 2023-06-09 ENCOUNTER — Other Ambulatory Visit (HOSPITAL_BASED_OUTPATIENT_CLINIC_OR_DEPARTMENT_OTHER): Payer: Self-pay

## 2023-06-23 ENCOUNTER — Telehealth: Payer: Self-pay | Admitting: Family Medicine

## 2023-06-23 DIAGNOSIS — M7022 Olecranon bursitis, left elbow: Secondary | ICD-10-CM

## 2023-06-23 NOTE — Patient Instructions (Signed)
Elbow Bursitis  Elbow (olecranon) bursitis is the swelling of the fluid-filled sac (bursa) between the tip of your elbow and your skin. A bursa acts as a cushion and protects the joint. If the bursa becomes irritated, it can fill with extra fluid and become inflamed. This condition is also called olecranon bursitis. What are the causes? This condition may be caused by: An elbow injury, such as falling onto the elbow. Leaning on hard surfaces for long periods of time. Infection from an injury that breaks the skin near the elbow. A bone growth (spur) that forms at the tip of the elbow. A medical condition that causes inflammation, such as gout or rheumatoid arthritis. Sometimes the cause is not known. What are the signs or symptoms? The first sign of elbow bursitis is usually swelling at the tip of the elbow. This can grow to be about the size of a golf ball. Swelling may start suddenly or develop gradually. Other symptoms may include: Pain when bending or leaning on the elbow. Stiffness, or not being able to move the elbow normally. If bursitis is caused by an infection, you may have: Redness, warmth, and tenderness of the elbow. Drainage of pus from the swollen area over the elbow, if the skin breaks open. How is this diagnosed? This condition may be diagnosed based on: Your symptoms and medical history. Any recent injuries you have had. A physical exam. X-rays to check for a bone spur or fracture. Draining fluid from the bursa to test it for infection. Blood tests to rule out gout or rheumatoid arthritis. How is this treated? Treatment for this condition depends on the cause. Treatment may include: Medicines. These may include: Over-the-counter medicines to relieve pain and inflammation. Antibiotic medicines if there is an infection. Injections of anti-inflammatory medicines (steroids). Draining fluid from the bursa. Wrapping your elbow with a bandage or compression arm  sleeve. Wearing elbow pads. If these treatments do not help, you may need surgery to remove the bursa. Follow these instructions at home: Medicines Take over-the-counter and prescription medicines only as told by your health care provider. If you were prescribed an antibiotic medicine, take it as told by your health care provider. Do not stop taking the antibiotic even if you start to feel better. Managing pain, stiffness, and swelling     If directed, put ice on the affected area. To do this: Put ice in a plastic bag. Place a towel between your skin and the bag. Leave the ice on for 20 minutes, 2-3 times a day. Remove the ice if your skin turns bright red. This is very important. If you cannot feel pain, heat, or cold, you have a greater risk of damage to the area. If directed, apply heat to the affected area as often as told by your health care provider. Use the heat source that your health care provider recommends, such as a moist heat pack or a heating pad. Place a towel between your skin and the heat source. Leave the heat on 20-30 minutes. Remove the heat if your skin turns bright red. This is especially important if you are unable to feel pain, heat, or cold. You may have a greater risk of getting burned. If your bursitis is caused by an injury, rest your elbow and wear your bandage or sleeve as told by your health care provider. Use elbow pads or elbow wraps to cushion your elbow and control swelling as needed. General instructions Avoid any activities that cause elbow pain. Ask  your health care provider what activities are safe for you. Keep all follow-up visits. This is important. Contact a health care provider if: You have a fever. Your symptoms do not get better with treatment. You have pain or swelling that gets worse, or it goes away and then comes back. You have pus draining from your elbow. You have redness around the elbow area. Your elbow is warm to the touch. Get  help right away if: You have trouble moving your arm, hand, or fingers. Summary Elbow (olecranon) bursitis is the swelling of the fluid-filled sac (bursa) between the tip of your elbow and your skin. Treatment for elbow bursitis depends on the cause. It may include medicines to relieve pain and inflammation, antibiotic medicines to treat an infection, or draining fluid from your elbow. Contact a health care provider if your symptoms do not get better with treatment, or if your symptoms go away and then come back. This information is not intended to replace advice given to you by your health care provider. Make sure you discuss any questions you have with your health care provider. Document Revised: 05/21/2021 Document Reviewed: 05/21/2021 Elsevier Patient Education  2024 [employer].

## 2023-06-23 NOTE — Progress Notes (Signed)
 Virtual Visit Consent   [name], you are scheduled for a virtual visit with a [facility] provider today. Just as with appointments in the office, your consent must be obtained to participate. Your consent will be active for this visit and any virtual visit you may have with one of our providers in the next 365 days. If you have a MyChart account, a copy of this consent can be sent to you electronically.  As this is a virtual visit, video technology does not allow for your provider to perform a traditional examination. This may limit your provider's ability to fully assess your condition. If your provider identifies any concerns that need to be evaluated in person or the need to arrange testing (such as labs, EKG, etc.), we will make arrangements to do so. Although advances in technology are sophisticated, we cannot ensure that it will always work on either your end or our end. If the connection with a video visit is poor, the visit may have to be switched to a telephone visit. With either a video or telephone visit, we are not always able to ensure that we have a secure connection.  By engaging in this virtual visit, you consent to the provision of healthcare and authorize for your insurance to be billed (if applicable) for the services provided during this visit. Depending on your insurance coverage, you may receive a charge related to this service.  I need to obtain your verbal consent now. Are you willing to proceed with your visit today? [name] has provided verbal consent on 06/23/2023 for a virtual visit (video or telephone). Loa Lamp, FNP  Date: 06/23/2023 7:07 PM  Virtual Visit via Video Note   I, Loa Lamp, connected with  [name]  ([id number], December 17, 1965) on 06/23/23 at  7:00 PM EST by a video-enabled telemedicine application and verified that I am speaking with the correct person using two identifiers.  Location: Patient: Virtual Visit Location  Patient: Home Provider: Virtual Visit Location Provider: Home Office   I discussed the limitations of evaluation and management by telemedicine and the availability of in person appointments. The patient expressed understanding and agreed to proceed.    History of Present Illness: [name] is a 58 y.o. who identifies as a male who was assigned male at birth, and is being seen today for left elbow pain with localized swelling after hitting elbow on wall. 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 Reported on 07/04/2015, Disp: , Rfl:    atorvastatin  (LIPITOR) 10 MG tablet, Take 1 tablet (10 mg total) by mouth daily., Disp: 30 tablet, Rfl: 3   atorvastatin  (LIPITOR) 10 MG tablet, Take one tablet by mouth at bedtime to lower bad cholesterol, Disp: 90 tablet, Rfl: 3   atorvastatin  (LIPITOR) 10 MG  tablet, Take 1 tablet (10 mg total) by mouth at bedtime to lower bad cholesterol., Disp: 90 tablet, Rfl: 3   benzonatate  (TESSALON ) 100 MG capsule, Take 1 capsule (100 mg total) by mouth 3 (three) times daily as needed., Disp: 30 capsule, Rfl: 0   Continuous Glucose Sensor (FREESTYLE LIBRE 3 SENSOR) MISC, Use to frequently monitor blood sugars and adjust insulin  doses. Change every 14 days.,  Disp: 2 each, Rfl: 5   Continuous Glucose Sensor (FREESTYLE LIBRE 3 SENSOR) MISC, Use as directed and change every 14 days. To frequently monitor blood sugard and adjust insulin  doses., Disp: 6 each, Rfl: 3   glucose blood (FREESTYLE PRECISION NEO TEST) test strip, Use to check blood sugars In Vitro three times daily 30 days, Disp: 100 strip, Rfl: 11   glucose blood test strip, 1 each by Other route 2 (two) times daily. , Disp: , Rfl:    HUMALOG  100 UNIT/ML injection, Inject 10-13 Units into the skin See admin instructions. Per pump and depends on sugar level, Disp: , Rfl:    HUMALOG  MIX 75/25 KWIKPEN (75-25) 100 UNIT/ML Kwikpen, INJECT 30 UNITS UNDER THE SKIN BEFORE BREAKFAST AND 22 UNITS BEFORE SUPPER, Disp: 45 mL, Rfl: 5   Insulin  Disposable Pump (OMNIPOD 5 G6 INTRO, GEN 5,) KIT, Use for continuous insulin  delivery daily, Disp: 1 kit, Rfl: 0   Insulin  Disposable Pump (OMNIPOD 5 G6 PODS, GEN 5,) MISC, Use 1 pod for continuous insulin  delivery every 48 hrs, Disp: 45 each, Rfl: 3   Insulin  Disposable Pump (OMNIPOD DASH 5 PACK PODS) MISC, , Disp: , Rfl:    Insulin  Disposable Pump (OMNIPOD DASH INTRO, GEN 4,) KIT, Use as directed with pods., Disp: 1 kit, Rfl: 0   Insulin  Disposable Pump (OMNIPOD DASH PODS, GEN 4,) MISC, Apply a new pod every 2 days., Disp: 15 each, Rfl: 5   insulin  glargine (LANTUS  SOLOSTAR) 100 UNIT/ML Solostar Pen, Inject 50 Units into the skin daily., Disp: 45 mL, Rfl: 3   insulin  lispro (HUMALOG  KWIKPEN) 200 UNIT/ML KwikPen, Inject 10-20 Units into the skin 3 (three) times daily., Disp: 60 mL, Rfl: 3   insulin  lispro (HUMALOG ) 100 UNIT/ML injection, Inject 1 mL (100 Units total) into the skin daily., Disp: 90 mL, Rfl: 3   insulin  lispro (HUMALOG ) 100 UNIT/ML injection, Use in insulin  pump. Max daily dose up to 100 UNITS daily for 90 days., Disp: 90 mL, Rfl: 3   insulin  lispro (HUMALOG ) 100 UNIT/ML injection, Inject 0.1-0.2 mLs (10-20 Units total) into the skin 3 (three) times  daily., Disp: 60 mL, Rfl: 3   Insulin  Lispro-aabc (LYUMJEV ) 100 UNIT/ML SOLN, Use as directed. Maximum daily dose of 100 units., Disp: 90 mL, Rfl: 3   Insulin  Lispro-aabc (LYUMJEV ) 100 UNIT/ML SOLN, USE AS DIRECTED. Maximum Daily Dose: 100 units, Disp: 90 mL, Rfl: 3   Insulin  Lispro-aabc (LYUMJEV ) 100 UNIT/ML SOLN, USE AS DIRECTED. Maximum daily dose 100 units., Disp: 90 mL, Rfl: 3   Insulin  Pen Needle 31G X 5 MM MISC, Use to inject insulin  once daily, Disp: 100 each, Rfl: 3   Insulin  Pen Needle 31G X 5 MM MISC, Use to inject insulin  4 times a day, Disp: 400 each, Rfl: 3   levocetirizine (XYZAL ) 5 MG tablet, Take 1 tablet (5 mg total) by mouth every evening., Disp: 30 tablet, Rfl: 3   metFORMIN  (GLUCOPHAGE ) 500 MG tablet, Take 500 mg by mouth daily. , Disp: , Rfl:    ondansetron  (ZOFRAN -ODT) 8 MG disintegrating tablet, Take  1 tablet (8 mg total) by mouth every 8 (eight) hours as needed for nausea or vomiting., Disp: 24 tablet, Rfl: 0   oseltamivir  (TAMIFLU ) 75 MG capsule, Take 1 capsule (75 mg total) by mouth 2 (two) times daily., Disp: 10 capsule, Rfl: 0   promethazine -dextromethorphan (PROMETHAZINE -DM) 6.25-15 MG/5ML syrup, Take 5 mLs by mouth 4 (four) times daily as needed., Disp: 118 mL, Rfl: 0   sildenafil  (VIAGRA ) 100 MG tablet, Take 1 tablet (100 mg total) by mouth daily as needed for erectile dysfunction. Max one tab in any 24 hour period., Disp: 10 tablet, Rfl: 0   sildenafil  (VIAGRA ) 100 MG tablet, Take 1 tablet (100 mg total) by mouth daily as needed., Disp: 10 tablet, Rfl: 3   traZODone  (DESYREL ) 50 MG tablet, Take 1/2-1 tablets (25-50 mg total) by mouth at bedtime as needed for sleep., Disp: 30 tablet, Rfl: 3   TRULICITY  0.75 MG/0.5ML SOPN, Inject 0.75 mg under the skin once a week to lower blood sugars, Disp: 2 mL, Rfl: 2   TRULICITY  1.5 MG/0.5ML SOPN, Inject 1.5 mg into the skin once a week., Disp: 6 mL, Rfl: 1   TRULICITY  1.5 MG/0.5ML SOAJ, Inject 1.5 mg into the skin once a week.,  Disp: 2 mL, Rfl: 5   TRULICITY  3 MG/0.5ML SOPN, Inject 3 mg under the skin once a week **Stop Trulicty 1.5 mg**, Disp: 6 mL, Rfl: 1  Observations/Objective: Patient is well-developed, well-nourished in no acute distress.  Resting comfortably  at home.  Head is normocephalic, atraumatic.  No labored breathing. Speech is clear and coherent with logical content.  Patient is alert and oriented at baseline.    Assessment and Plan: 1. Olecranon bursitis of left elbow (Primary)  Urgent care in the am for drainage.   Follow Up Instructions: I discussed the assessment and treatment plan with the patient. The patient was provided an opportunity to ask questions and all were answered. The patient agreed with the plan and demonstrated an understanding of the instructions.  A copy of instructions were sent to the patient via MyChart unless otherwise noted below.     The patient was advised to call back or seek an in-person evaluation if the symptoms worsen or if the condition fails to improve as anticipated.    Phuoc Huy, FNP

## 2023-08-19 ENCOUNTER — Other Ambulatory Visit (HOSPITAL_BASED_OUTPATIENT_CLINIC_OR_DEPARTMENT_OTHER): Payer: Self-pay

## 2023-09-29 ENCOUNTER — Other Ambulatory Visit (HOSPITAL_BASED_OUTPATIENT_CLINIC_OR_DEPARTMENT_OTHER): Payer: Self-pay

## 2023-10-08 DIAGNOSIS — E1165 Type 2 diabetes mellitus with hyperglycemia: Secondary | ICD-10-CM | POA: Diagnosis not present

## 2023-10-08 DIAGNOSIS — E291 Testicular hypofunction: Secondary | ICD-10-CM | POA: Diagnosis not present

## 2023-10-08 DIAGNOSIS — D751 Secondary polycythemia: Secondary | ICD-10-CM | POA: Diagnosis not present

## 2023-10-08 DIAGNOSIS — E559 Vitamin D deficiency, unspecified: Secondary | ICD-10-CM | POA: Diagnosis not present

## 2023-10-12 ENCOUNTER — Other Ambulatory Visit (HOSPITAL_BASED_OUTPATIENT_CLINIC_OR_DEPARTMENT_OTHER): Payer: Self-pay

## 2023-10-12 MED ORDER — ATORVASTATIN CALCIUM 10 MG PO TABS
10.0000 mg | ORAL_TABLET | Freq: Every day | ORAL | 3 refills | Status: AC
  Filled 2023-10-26: qty 90, 90d supply, fill #0

## 2023-10-12 MED ORDER — SILDENAFIL CITRATE 100 MG PO TABS
100.0000 mg | ORAL_TABLET | Freq: Every day | ORAL | 5 refills | Status: AC | PRN
  Filled 2023-10-12 – 2023-10-26 (×2): qty 10, 10d supply, fill #0

## 2023-10-22 ENCOUNTER — Other Ambulatory Visit (HOSPITAL_BASED_OUTPATIENT_CLINIC_OR_DEPARTMENT_OTHER): Payer: Self-pay

## 2023-10-26 ENCOUNTER — Other Ambulatory Visit (HOSPITAL_BASED_OUTPATIENT_CLINIC_OR_DEPARTMENT_OTHER): Payer: Self-pay

## 2023-11-13 DIAGNOSIS — Z794 Long term (current) use of insulin: Secondary | ICD-10-CM | POA: Diagnosis not present

## 2023-11-13 DIAGNOSIS — E1165 Type 2 diabetes mellitus with hyperglycemia: Secondary | ICD-10-CM | POA: Diagnosis not present

## 2023-11-20 DIAGNOSIS — E1165 Type 2 diabetes mellitus with hyperglycemia: Secondary | ICD-10-CM | POA: Diagnosis not present

## 2023-11-20 DIAGNOSIS — Z794 Long term (current) use of insulin: Secondary | ICD-10-CM | POA: Diagnosis not present

## 2023-11-23 ENCOUNTER — Other Ambulatory Visit (HOSPITAL_BASED_OUTPATIENT_CLINIC_OR_DEPARTMENT_OTHER): Payer: Self-pay

## 2023-11-23 ENCOUNTER — Ambulatory Visit: Admitting: Medical

## 2023-11-23 ENCOUNTER — Encounter: Payer: Self-pay | Admitting: Medical

## 2023-11-23 VITALS — BP 132/80 | HR 70 | Temp 98.1°F | Ht 70.0 in | Wt 214.2 lb

## 2023-11-23 DIAGNOSIS — K0889 Other specified disorders of teeth and supporting structures: Secondary | ICD-10-CM

## 2023-11-23 DIAGNOSIS — R6884 Jaw pain: Secondary | ICD-10-CM | POA: Diagnosis not present

## 2023-11-23 DIAGNOSIS — H9202 Otalgia, left ear: Secondary | ICD-10-CM

## 2023-11-23 DIAGNOSIS — M545 Low back pain, unspecified: Secondary | ICD-10-CM

## 2023-11-23 DIAGNOSIS — Z1211 Encounter for screening for malignant neoplasm of colon: Secondary | ICD-10-CM

## 2023-11-23 MED ORDER — CYCLOBENZAPRINE HCL 5 MG PO TABS
ORAL_TABLET | ORAL | 0 refills | Status: AC
  Filled 2023-11-23: qty 5, 5d supply, fill #0

## 2023-11-23 MED ORDER — NEOMYCIN-POLYMYXIN-HC 3.5-10000-1 OT SOLN
3.0000 [drp] | Freq: Three times a day (TID) | OTIC | 0 refills | Status: AC
  Filled 2023-11-23: qty 10, 20d supply, fill #0

## 2023-11-23 MED ORDER — AMOXICILLIN-POT CLAVULANATE 875-125 MG PO TABS
1.0000 | ORAL_TABLET | Freq: Two times a day (BID) | ORAL | 0 refills | Status: DC
Start: 2023-11-23 — End: 2024-03-30
  Filled 2023-11-23: qty 20, 10d supply, fill #0

## 2023-11-23 MED ORDER — DICLOFENAC SODIUM 75 MG PO TBEC
75.0000 mg | DELAYED_RELEASE_TABLET | Freq: Two times a day (BID) | ORAL | 0 refills | Status: AC
  Filled 2023-11-23: qty 14, 7d supply, fill #0

## 2023-11-23 NOTE — Patient Instructions (Signed)
 Jaw Pain(tooth pain and some ear pain) Acute jaw pain with swelling, differential includes dental abscess, parotid gland infection, or lymphadenopathy. Augmentin prescribed for dental and parotid infections. Ear drops prescribed for possible ear canal inflammation. - Prescribe Augmentin 875 mg twice daily for 10 days. - Prescribe ear drops, 3 drops to the left ear, 3 times daily. - Advise follow-up with a dentist within 10 days.  Back Pain Chronic back pain likely musculoskeletal due to recent heavy lifting. Diclofenac  prescribed for anti-inflammatory properties, and Flexeril  for muscle relaxation. Consider x-ray if pain persists beyond 7-10 days. - Prescribe diclofenac  75 mg twice daily, advise against concurrent use of Aleve or ibuprofen. - Prescribe Flexeril  5 mg, one tablet at night. - Provide back stretching exercises. - Order x-ray if pain persists beyond 7-10 days. -future xray order placed  General Health Maintenance Requires colonoscopy screening. Referral to gastroenterologist for colonoscopy placed. Lung cancer screening follow-up after December 05, 2023. - Refer to gastroenterologist for colonoscopy. - Advise scheduling a dental appointment. - Advise scheduling a wellness exam. in about 2-3 weeks - Advise follow-up on lung cancer screening after December 05, 2023.   Back Exercises These exercises help to make your trunk and back strong. They also help to keep the lower back flexible. Doing these exercises can help to prevent or lessen pain in your lower back. If you have back pain, try to do these exercises 2-3 times each day or as told by your doctor. As you get better, do the exercises once each day. Repeat the exercises more often as told by your doctor. To stop back pain from coming back, do the exercises once each day, or as told by your doctor. Do exercises exactly as told by your doctor. Stop right away if you feel sudden pain or your pain gets worse. Exercises Single knee to  chest Do these steps 3-5 times in a row for each leg: Lie on your back on a firm bed or the floor with your legs stretched out. Bring one knee to your chest. Grab your knee or thigh with both hands and hold it in place. Pull on your knee until you feel a gentle stretch in your lower back or butt. Keep doing the stretch for 10-30 seconds. Slowly let go of your leg and straighten it. Pelvic tilt Do these steps 5-10 times in a row: Lie on your back on a firm bed or the floor with your legs stretched out. Bend your knees so they point up to the ceiling. Your feet should be flat on the floor. Tighten your lower belly (abdomen) muscles to press your lower back against the floor. This will make your tailbone point up to the ceiling instead of pointing down to your feet or the floor. Stay in this position for 5-10 seconds while you gently tighten your muscles and breathe evenly. Cat-cow Do these steps until your lower back bends more easily: Get on your hands and knees on a firm bed or the floor. Keep your hands under your shoulders, and keep your knees under your hips. You may put padding under your knees. Let your head hang down toward your chest. Tighten (contract) the muscles in your belly. Point your tailbone toward the floor so your lower back becomes rounded like the back of a cat. Stay in this position for 5 seconds. Slowly lift your head. Let the muscles of your belly relax. Point your tailbone up toward the ceiling so your back forms a sagging arch like  the back of a cow. Stay in this position for 5 seconds.  Press-ups Do these steps 5-10 times in a row: Lie on your belly (face-down) on a firm bed or the floor. Place your hands near your head, about shoulder-width apart. While you keep your back relaxed and keep your hips on the floor, slowly straighten your arms to raise the top half of your body and lift your shoulders. Do not use your back muscles. You may change where you place your  hands to make yourself more comfortable. Stay in this position for 5 seconds. Keep your back relaxed. Slowly return to lying flat on the floor.  Bridges Do these steps 10 times in a row: Lie on your back on a firm bed or the floor. Bend your knees so they point up to the ceiling. Your feet should be flat on the floor. Your arms should be flat at your sides, next to your body. Tighten your butt muscles and lift your butt off the floor until your waist is almost as high as your knees. If you do not feel the muscles working in your butt and the back of your thighs, slide your feet 1-2 inches (2.5-5 cm) farther away from your butt. Stay in this position for 3-5 seconds. Slowly lower your butt to the floor, and let your butt muscles relax. If this exercise is too easy, try doing it with your arms crossed over your chest. Belly crunches Do these steps 5-10 times in a row: Lie on your back on a firm bed or the floor with your legs stretched out. Bend your knees so they point up to the ceiling. Your feet should be flat on the floor. Cross your arms over your chest. Tip your chin a little bit toward your chest, but do not bend your neck. Tighten your belly muscles and slowly raise your chest just enough to lift your shoulder blades a tiny bit off the floor. Avoid raising your body higher than that because it can put too much stress on your lower back. Slowly lower your chest and your head to the floor. Back lifts Do these steps 5-10 times in a row: Lie on your belly (face-down) with your arms at your sides, and rest your forehead on the floor. Tighten the muscles in your legs and your butt. Slowly lift your chest off the floor while you keep your hips on the floor. Keep the back of your head in line with the curve in your back. Look at the floor while you do this. Stay in this position for 3-5 seconds. Slowly lower your chest and your face to the floor. Contact a doctor if: Your back pain gets a  lot worse when you do an exercise. Your back pain does not get better within 2 hours after you exercise. If you have any of these problems, stop doing the exercises. Do not do them again unless your doctor says it is okay. Get help right away if: You have sudden, very bad back pain. If this happens, stop doing the exercises. Do not do them again unless your doctor says it is okay. This information is not intended to replace advice given to you by your health care provider. Make sure you discuss any questions you have with your health care provider. Document Revised: 08/08/2020 Document Reviewed: 08/08/2020 Elsevier Patient Education  2024 [employer].

## 2023-11-23 NOTE — Progress Notes (Signed)
 Subjective:    Patient ID: [name], male    DOB: [birth year], 58 y.o.   MRN: [id number]  HPI   [name] is a 58 year old male who presents with jaw pain and swelling.  He has experienced significant swelling and pain in his jaw since Thursday evening, which progressively worsened. By the following day, the pain was severe enough to prevent chewing, and his face was visibly swollen. Initially suspecting a toothache, he was advised by a pharmacist that it could be an outer ear infection. He took Aleve, which provided some pain relief by the next day. The pain is localized to the area in front of the ear along the jawline and extends upwards. He feels pressure when biting down, particularly in the back tooth and jaw area. He has not seen a dentist in several years. No pain when pressing on the jaw today, although it was very tender yesterday. No pain radiating to his legs, but he feels pressure in the jaw when biting down.  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 The pain is across his back, and his wife noted a knot in the area. He has not been taking any specific medication for this pain, aside from Aleve PM to aid with sleep.  He works in [employer], which involves heavy lifting and physical labor. He has not undergone a colonoscopy or a recent dental check-up.       Review of Systems  Constitutional:  Negative for chills, fatigue and fever.  HENT:  Positive for ear pain. Negative for congestion.        Left jaw area pain and tooth pain. Left side. Some ear pain  Respiratory:  Negative for cough, chest tightness, shortness of breath and wheezing.   Cardiovascular:  Negative for chest pain and palpitations.  Gastrointestinal:  Negative for abdominal pain, blood in stool and nausea.  Genitourinary:  Negative for dysuria.  Musculoskeletal:  Positive  for back pain. Negative for myalgias and neck pain.  Skin:  Negative for rash.  Neurological:  Negative for dizziness, weakness and light-headedness.  Hematological:  Positive for adenopathy. Does not bruise/bleed easily.       Subtle enlarge lymph node.  Psychiatric/Behavioral:  Negative for behavioral problems and decreased concentration.     Past Medical History:  Diagnosis Date   Diabetes mellitus without complication (HCC)      Social History   Socioeconomic History   Marital status: Married    Spouse name: Not on file   Number of children: Not on file   Years of education: Not on file   Highest education level: Not on file  Occupational History   Not on file  Tobacco Use   Smoking status: Former    Current packs/day: 1.00    Average packs/day: 1 pack/day for 39.1 years (39.1 ttl pk-yrs)    Types: Cigarettes    Start date: 10/29/1984   Smokeless tobacco: Never  Vaping Use   Vaping status: Some Days  Substance and Sexual Activity   Alcohol use: No    Alcohol/week: 0.0 standard drinks of alcohol   Drug use: No   Sexual activity: Not on file  Other Topics Concern   Not on file  Social History Narrative   Not on file   Social Drivers of Health   Financial Resource Strain: Not on file  Food  Insecurity: Not on file  Transportation Needs: Not on file  Physical Activity: Not on file  Stress: Not on file  Social Connections: Not on file  Intimate Partner Violence: Not on file    Past Surgical History:  Procedure Laterality Date   APPENDECTOMY      Family History  Problem Relation Age of Onset   Lupus Mother    Diabetes Maternal Grandmother    Arthritis Maternal Grandmother    Arthritis Maternal Grandfather    Arthritis Paternal Grandmother    Arthritis Paternal Grandfather     No Known Allergies  Current Outpatient Medications on File Prior to Visit  Medication Sig Dispense Refill   aspirin  EC 81 MG tablet Take 81 mg by mouth daily. Reported on  07/04/2015     atorvastatin  (LIPITOR) 10 MG tablet Take 1 tablet (10 mg total) by mouth daily. 30 tablet 3   atorvastatin  (LIPITOR) 10 MG tablet Take one tablet by mouth at bedtime to lower bad cholesterol 90 tablet 3   atorvastatin  (LIPITOR) 10 MG tablet Take 1 tablet (10 mg total) by mouth at bedtime to lower bad cholesterol. 90 tablet 3   atorvastatin  (LIPITOR) 10 MG tablet Take 1 tablet (10 mg total) by mouth daily. 90 tablet 3   benzonatate  (TESSALON ) 100 MG capsule Take 1 capsule (100 mg total) by mouth 3 (three) times daily as needed. 30 capsule 0   Continuous Glucose Sensor (FREESTYLE LIBRE 3 SENSOR) MISC Use to frequently monitor blood sugars and adjust insulin  doses. Change every 14 days. 2 each 5   Continuous Glucose Sensor (FREESTYLE LIBRE 3 SENSOR) MISC Use as directed and change every 14 days. To frequently monitor blood sugard and adjust insulin  doses. 6 each 3   glucose blood (FREESTYLE PRECISION NEO TEST) test strip Use to check blood sugars In Vitro three times daily 30 days 100 strip 11   glucose blood test strip 1 each by Other route 2 (two) times daily.      HUMALOG  100 UNIT/ML injection Inject 10-13 Units into the skin See admin instructions. Per pump and depends on sugar level     HUMALOG  MIX 75/25 KWIKPEN (75-25) 100 UNIT/ML Kwikpen INJECT 30 UNITS UNDER THE SKIN BEFORE BREAKFAST AND 22 UNITS BEFORE SUPPER 45 mL 5   Insulin  Disposable Pump (OMNIPOD 5 G6 INTRO, GEN 5,) KIT Use for continuous insulin  delivery daily 1 kit 0   Insulin  Disposable Pump (OMNIPOD 5 G6 PODS, GEN 5,) MISC Use 1 pod for continuous insulin  delivery every 48 hrs 45 each 3   Insulin  Disposable Pump (OMNIPOD DASH 5 PACK PODS) MISC      Insulin  Disposable Pump (OMNIPOD DASH INTRO, GEN 4,) KIT Use as directed with pods. 1 kit 0   Insulin  Disposable Pump (OMNIPOD DASH PODS, GEN 4,) MISC Apply a new pod every 2 days. 15 each 5   insulin  glargine (LANTUS  SOLOSTAR) 100 UNIT/ML Solostar Pen Inject 50 Units into the  skin daily. 45 mL 3   insulin  lispro (HUMALOG  KWIKPEN) 200 UNIT/ML KwikPen Inject 10-20 Units into the skin 3 (three) times daily. 60 mL 3   insulin  lispro (HUMALOG ) 100 UNIT/ML injection Inject 1 mL (100 Units total) into the skin daily. 90 mL 3   insulin  lispro (HUMALOG ) 100 UNIT/ML injection Use in insulin  pump. Max daily dose up to 100 UNITS daily for 90 days. 90 mL 3   insulin  lispro (HUMALOG ) 100 UNIT/ML injection Inject 0.1-0.2 mLs (10-20 Units total) into the skin 3 (three)  times daily. 60 mL 3   Insulin  Lispro-aabc (LYUMJEV ) 100 UNIT/ML SOLN Use as directed. Maximum daily dose of 100 units. 90 mL 3   Insulin  Lispro-aabc (LYUMJEV ) 100 UNIT/ML SOLN USE AS DIRECTED. Maximum Daily Dose: 100 units 90 mL 3   Insulin  Lispro-aabc (LYUMJEV ) 100 UNIT/ML SOLN USE AS DIRECTED. Maximum daily dose 100 units. 90 mL 3   Insulin  Pen Needle 31G X 5 MM MISC Use to inject insulin  once daily 100 each 3   Insulin  Pen Needle 31G X 5 MM MISC Use to inject insulin  4 times a day 400 each 3   levocetirizine (XYZAL ) 5 MG tablet Take 1 tablet (5 mg total) by mouth every evening. 30 tablet 3   metFORMIN  (GLUCOPHAGE ) 500 MG tablet Take 500 mg by mouth daily.      ondansetron  (ZOFRAN -ODT) 8 MG disintegrating tablet Take 1 tablet (8 mg total) by mouth every 8 (eight) hours as needed for nausea or vomiting. 24 tablet 0   oseltamivir  (TAMIFLU ) 75 MG capsule Take 1 capsule (75 mg total) by mouth 2 (two) times daily. 10 capsule 0   promethazine -dextromethorphan (PROMETHAZINE -DM) 6.25-15 MG/5ML syrup Take 5 mLs by mouth 4 (four) times daily as needed. 118 mL 0   sildenafil  (VIAGRA ) 100 MG tablet Take 1 tablet (100 mg total) by mouth daily as needed for erectile dysfunction. Max one tab in any 24 hour period. 10 tablet 0   sildenafil  (VIAGRA ) 100 MG tablet Take 1 tablet (100 mg total) by mouth daily as needed. 10 tablet 3   sildenafil  (VIAGRA ) 100 MG tablet Take 1 tablet (100 mg total) by mouth daily as needed. 10 tablet 5    traZODone  (DESYREL ) 50 MG tablet Take 1/2-1 tablets (25-50 mg total) by mouth at bedtime as needed for sleep. 30 tablet 3   TRULICITY  0.75 MG/0.5ML SOPN Inject 0.75 mg under the skin once a week to lower blood sugars 2 mL 2   TRULICITY  1.5 MG/0.5ML SOAJ Inject 1.5 mg into the skin once a week. 2 mL 5   TRULICITY  1.5 MG/0.5ML SOPN Inject 1.5 mg into the skin once a week. 6 mL 1   TRULICITY  3 MG/0.5ML SOPN Inject 3 mg under the skin once a week **Stop Trulicty 1.5 mg** 6 mL 1   No current facility-administered medications on file prior to visit.    BP 132/80   Pulse 70   Temp 98.1 F (36.7 C)   Ht 5' 10 (1.778 m)   Wt 214 lb 3.2 oz (97.2 kg)   SpO2 96%   BMI 30.73 kg/m        Objective:   Physical Exam   General Appearance- Not in acute distress.     Chest and Lung Exam Auscultation: Breath sounds:-Normal. Clear even and unlabored. Adventitious sounds:- No Adventitious sounds.  Cardiovascular Auscultation:Rythm - Regular, rate and rythm. Heart Sounds -Normal heart sounds.  Abdomen Inspection:-Inspection Normal.  Palpation/Perucssion: Palpation and Percussion of the abdomen reveal- Non Tender, No Rebound tenderness, No rigidity(Guarding) and No Palpable abdominal masses.  Liver:-Normal.  Spleen:- Normal.   Back Mid lumbar spine tenderness to palpation. Pain on straight leg lift. Pain on lateral movements and flexion/extension of the spine.  Lower ext neurologic  L5-S1 sensation intact bilaterally. Normal patellar reflexes bilaterally. No foot drop bilaterally.    Heent- left ear canal overall normal and tm normal. No tragus tenderness. Left jaw faint mild tender and back lower tooth.  -no sinus pressure to palpation.  Assessment & Plan:  Jaw Pain(tooth pain and some ear pain) Acute jaw pain with swelling, differential includes dental abscess, parotid gland infection, or lymphadenopathy. Augmentin prescribed for dental and parotid infections. Ear drops  prescribed for possible ear canal inflammation. - Prescribe Augmentin 875 mg twice daily for 10 days. - Prescribe ear drops, 3 drops to the left ear, 3 times daily. - Advise follow-up with a dentist within 10 days.  Back Pain Chronic back pain likely musculoskeletal due to recent heavy lifting. Diclofenac  prescribed for anti-inflammatory properties, and Flexeril  for muscle relaxation. Consider x-ray if pain persists beyond 7-10 days. - Prescribe diclofenac  75 mg twice daily, advise against concurrent use of Aleve or ibuprofen. - Prescribe Flexeril  5 mg, one tablet at night. - Provide back stretching exercises. - Order x-ray if pain persists beyond 7-10 days. -future xray order placed  General Health Maintenance Requires colonoscopy screening. Referral to gastroenterologist for colonoscopy placed. Lung cancer screening follow-up after December 05, 2023. - Refer to gastroenterologist for colonoscopy. - Advise scheduling a dental appointment. - Advise scheduling a wellness exam. in about 2-3 weeks - Advise follow-up on lung cancer screening after December 05, 2023.  Hazell Siwik, PA-C

## 2023-11-24 ENCOUNTER — Other Ambulatory Visit (HOSPITAL_BASED_OUTPATIENT_CLINIC_OR_DEPARTMENT_OTHER): Payer: Self-pay

## 2023-11-26 ENCOUNTER — Other Ambulatory Visit: Payer: Self-pay | Admitting: Acute Care

## 2023-11-26 DIAGNOSIS — Z87891 Personal history of nicotine dependence: Secondary | ICD-10-CM

## 2023-11-26 DIAGNOSIS — Z122 Encounter for screening for malignant neoplasm of respiratory organs: Secondary | ICD-10-CM

## 2023-12-03 ENCOUNTER — Other Ambulatory Visit (HOSPITAL_BASED_OUTPATIENT_CLINIC_OR_DEPARTMENT_OTHER): Payer: Self-pay

## 2023-12-07 ENCOUNTER — Ambulatory Visit

## 2023-12-24 ENCOUNTER — Ambulatory Visit

## 2023-12-24 DIAGNOSIS — F17210 Nicotine dependence, cigarettes, uncomplicated: Secondary | ICD-10-CM | POA: Diagnosis not present

## 2023-12-24 DIAGNOSIS — Z122 Encounter for screening for malignant neoplasm of respiratory organs: Secondary | ICD-10-CM

## 2023-12-24 DIAGNOSIS — Z87891 Personal history of nicotine dependence: Secondary | ICD-10-CM

## 2024-01-04 ENCOUNTER — Other Ambulatory Visit: Payer: Self-pay

## 2024-01-04 DIAGNOSIS — F17210 Nicotine dependence, cigarettes, uncomplicated: Secondary | ICD-10-CM

## 2024-01-04 DIAGNOSIS — Z122 Encounter for screening for malignant neoplasm of respiratory organs: Secondary | ICD-10-CM

## 2024-01-04 DIAGNOSIS — Z87891 Personal history of nicotine dependence: Secondary | ICD-10-CM

## 2024-01-14 ENCOUNTER — Other Ambulatory Visit (HOSPITAL_BASED_OUTPATIENT_CLINIC_OR_DEPARTMENT_OTHER): Payer: Self-pay

## 2024-01-15 ENCOUNTER — Other Ambulatory Visit (HOSPITAL_BASED_OUTPATIENT_CLINIC_OR_DEPARTMENT_OTHER): Payer: Self-pay

## 2024-01-27 ENCOUNTER — Encounter: Payer: Self-pay | Admitting: Medical

## 2024-02-11 ENCOUNTER — Telehealth: Payer: Self-pay

## 2024-02-11 NOTE — Telephone Encounter (Signed)
Called pt apt scheduled

## 2024-02-16 ENCOUNTER — Ambulatory Visit: Admitting: Medical

## 2024-03-23 ENCOUNTER — Other Ambulatory Visit (HOSPITAL_BASED_OUTPATIENT_CLINIC_OR_DEPARTMENT_OTHER): Payer: Self-pay

## 2024-03-30 ENCOUNTER — Ambulatory Visit: Admission: EM | Admit: 2024-03-30 | Discharge: 2024-03-30 | Disposition: A | Discharge status: Home / Self Care

## 2024-03-30 ENCOUNTER — Other Ambulatory Visit (HOSPITAL_BASED_OUTPATIENT_CLINIC_OR_DEPARTMENT_OTHER): Payer: Self-pay

## 2024-03-30 DIAGNOSIS — H5789 Other specified disorders of eye and adnexa: Secondary | ICD-10-CM | POA: Diagnosis not present

## 2024-03-30 DIAGNOSIS — H18831 Recurrent erosion of cornea, right eye: Secondary | ICD-10-CM | POA: Diagnosis not present

## 2024-03-30 NOTE — ED Provider Notes (Signed)
 [facility]    CSN: [id number] Arrival date & time: 03/30/24  0802      History   Chief Complaint Chief Complaint  Patient presents with   Eye Problem    RT    HPI [name] is a 58 y.o. male.   HPI 58 year old male presents with right eye irritation since this morning.  PMH significant for CVA, T2DM due to underlying condition with unspecified complications, and acquired polycythemia.  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 Authorizing Provider  aspirin  EC 81 MG tablet Take 81 mg by mouth daily. Reported on 07/04/2015    [provider]  atorvastatin  (LIPITOR) 10 MG tablet Take 1 tablet (10 mg total) by mouth daily. 01/07/19   Saguier, [place], PA-C  atorvastatin  (LIPITOR) 10 MG tablet Take one tablet by mouth at bedtime to lower bad cholesterol 01/22/21     atorvastatin  (LIPITOR) 10 MG tablet Take 1 tablet (10 mg total) by mouth at bedtime to lower bad cholesterol. 08/13/21     atorvastatin  (LIPITOR) 10 MG tablet Take 1 tablet (10 mg total) by mouth daily. 10/12/23     Continuous Glucose Sensor (FREESTYLE LIBRE 3 SENSOR) MISC Use to  frequently monitor blood sugars and adjust insulin  doses. Change every 14 days. 08/27/22     Continuous Glucose Sensor (FREESTYLE LIBRE 3 SENSOR) MISC Use as directed and change every 14 days. To frequently monitor blood sugard and adjust insulin  doses. 11/24/22     cyclobenzaprine  (FLEXERIL ) 5 MG tablet Take 1 tablet by mouth at bedtime as needed for muscle spasms / back pain. 11/23/23   Saguier, [place], PA-C  diclofenac  (VOLTAREN ) 75 MG EC tablet Take 1 tablet (75 mg total) by mouth 2 (two) times daily. 11/23/23   Saguier, [place], PA-C  glucose blood (FREESTYLE PRECISION NEO TEST) test strip Use to check blood sugars In Vitro three times daily 30 days 10/08/22     glucose blood test strip 1 each by Other route 2 (two) times daily.  02/14/15   [provider]  HUMALOG  100 UNIT/ML injection Inject 10-13 Units into the skin See admin instructions. Per pump and depends on sugar level 04/26/19   [provider]  HUMALOG  MIX 75/25 KWIKPEN (75-25) 100 UNIT/ML Kwikpen INJECT 30 UNITS UNDER THE SKIN BEFORE BREAKFAST AND 22 UNITS BEFORE SUPPER 10/19/19 11/23/23  Braulio Hough, MD  Insulin  Disposable Pump (OMNIPOD 5 G6 INTRO, GEN 5,) KIT Use for continuous insulin  delivery daily 09/09/22     Insulin  Disposable Pump (OMNIPOD 5 G6 PODS, GEN 5,) MISC Use 1 pod for continuous insulin  delivery every 48 hrs 09/09/22     Insulin  Disposable Pump (OMNIPOD  DASH 5 PACK PODS) MISC  05/20/19   [provider]  Insulin  Disposable Pump (OMNIPOD DASH INTRO, GEN 4,) KIT Use as directed with pods. 08/27/22     Insulin  Disposable Pump (OMNIPOD DASH PODS, GEN 4,) MISC Apply a new pod every 2 days. 08/27/22   Braulio Hough, MD  insulin  glargine (LANTUS  SOLOSTAR) 100 UNIT/ML Solostar Pen Inject 50 Units into the skin daily. 05/22/23     insulin  lispro (HUMALOG  KWIKPEN) 200 UNIT/ML KwikPen Inject 10-20 Units into the skin 3 (three) times daily. 05/22/23     insulin  lispro (HUMALOG ) 100 UNIT/ML injection Inject 1 mL (100  Units total) into the skin daily. 05/26/22     insulin  lispro (HUMALOG ) 100 UNIT/ML injection Use in insulin  pump. Max daily dose up to 100 UNITS daily for 90 days. 08/27/22     insulin  lispro (HUMALOG ) 100 UNIT/ML injection Inject 0.1-0.2 mLs (10-20 Units total) into the skin 3 (three) times daily. 05/22/23     Insulin  Lispro-aabc (LYUMJEV ) 100 UNIT/ML SOLN Use as directed. Maximum daily dose of 100 units. 12/03/20     Insulin  Lispro-aabc (LYUMJEV ) 100 UNIT/ML SOLN USE AS DIRECTED. Maximum Daily Dose: 100 units 05/10/21     Insulin  Lispro-aabc (LYUMJEV ) 100 UNIT/ML SOLN USE AS DIRECTED. Maximum daily dose 100 units. 08/13/21     Insulin  Pen Needle 31G X 5 MM MISC Use to inject insulin  once daily 05/24/21     Insulin  Pen Needle 31G X 5 MM MISC Use to inject insulin  4 times a day 05/22/23     levocetirizine (XYZAL ) 5 MG tablet Take 1 tablet (5 mg total) by mouth every evening. 10/05/20   Saguier, [place], PA-C  metFORMIN  (GLUCOPHAGE ) 500 MG tablet Take 500 mg by mouth daily.     [provider]  neomycin -polymyxin-hydrocortisone (CORTISPORIN ) OTIC solution Place 3 drops into the left ear 3 (three) times daily. 11/23/23   Saguier, [place], PA-C  ondansetron  (ZOFRAN -ODT) 8 MG disintegrating tablet Take 1 tablet (8 mg total) by mouth every 8 (eight) hours as needed for nausea or vomiting. 06/11/22   Tarrin Menn, FNP  oseltamivir  (TAMIFLU ) 75 MG capsule Take 1 capsule (75 mg total) by mouth 2 (two) times daily. 01/13/22   Vivienne Delon HERO, PA-C  promethazine -dextromethorphan (PROMETHAZINE -DM) 6.25-15 MG/5ML syrup Take 5 mLs by mouth 4 (four) times daily as needed. 01/13/22   Vivienne Delon HERO, PA-C  sildenafil  (VIAGRA ) 100 MG tablet Take 1 tablet (100 mg total) by mouth daily as needed for erectile dysfunction. Max one tab in any 24 hour period. 01/03/21   Saguier, [place], PA-C  sildenafil  (VIAGRA ) 100 MG tablet Take 1 tablet (100 mg total) by mouth daily as needed. 10/30/22     sildenafil  (VIAGRA ) 100  MG tablet Take 1 tablet (100 mg total) by mouth daily as needed. 10/12/23     traZODone  (DESYREL ) 50 MG tablet Take 1/2-1 tablets (25-50 mg total) by mouth at bedtime as needed for sleep. 12/27/21   Saguier, [place], PA-C  TRULICITY  0.75 MG/0.5ML SOPN Inject 0.75 mg under the skin once a week to lower blood sugars 05/08/21     TRULICITY  1.5 MG/0.5ML SOAJ Inject 1.5 mg into the skin once a week. 05/26/22     TRULICITY  1.5 MG/0.5ML SOPN Inject 1.5 mg into the skin once a week. 08/13/21     TRULICITY  3 MG/0.5ML SOPN Inject 3 mg under the skin once a week **Stop Trulicty 1.5 mg** 11/19/21       Family History Family History  Problem Relation Age of Onset   Lupus Mother    Diabetes Maternal Grandmother    Arthritis Maternal Grandmother    Arthritis Maternal Grandfather    Arthritis Paternal Grandmother    Arthritis Paternal Grandfather     Social History Social History   Tobacco Use   Smoking status: Former    Current packs/day: 1.00    Average packs/day: 1 pack/day for 39.4 years (39.4 ttl pk-yrs)    Types: Cigarettes    Start date: 10/29/1984   Smokeless tobacco: Never  Vaping Use   Vaping status: Some Days  Substance Use Topics   Alcohol use: No    Alcohol/week: 0.0 standard drinks of alcohol   Drug use: No     Allergies   Patient has no known allergies.   Review of Systems Review of Systems  Eyes:        Right eye irritation upon awakening this morning  All other systems reviewed and are negative.    Physical Exam Triage Vital Signs ED Triage Vitals  Encounter Vitals Group     BP 03/30/24 0823 (!) 153/91     Girls Systolic BP Percentile --      Girls Diastolic BP Percentile --      Boys Systolic BP Percentile --      Boys Diastolic BP Percentile --      Pulse Rate 03/30/24 0823 60     Resp 03/30/24 0823 17     Temp 03/30/24 0823 98.3 F (36.8 C)     Temp Source 03/30/24 0823 Oral     SpO2 03/30/24 0823 97 %     Weight --      Height --      Head Circumference  --      Peak Flow --      Pain Score 03/30/24 0825 9     Pain Loc --      Pain Education --      Exclude from Growth Chart --    No data found.  Updated Vital Signs BP (!) 153/91 (BP Location: Right Arm)   Pulse 60   Temp 98.3 F (36.8 C) (Oral)   Resp 17   SpO2 97%    Physical Exam Vitals and nursing note reviewed.  Constitutional:      Appearance: Normal appearance. He is normal weight.  HENT:     Head: Normocephalic and atraumatic.     Mouth/Throat:     Mouth: Mucous membranes are moist.     Pharynx: Oropharynx is clear.  Eyes:     Extraocular Movements: Extraocular movements intact.     Conjunctiva/sclera: Conjunctivae normal.     Pupils: Pupils are equal, round, and reactive to light.     Comments: Bilateral eyes swollen, edematous; eyes rinsed x 3 with eyewash, inspected with loupes no foreign body noted, unable to invert upper eyelids due to swelling of bilateral eyes  Cardiovascular:     Rate and Rhythm: Normal rate and regular rhythm.     Pulses: Normal pulses.     Heart sounds: Normal heart sounds.  Pulmonary:     Effort: Pulmonary effort is normal.     Breath sounds: Normal breath sounds. No wheezing, rhonchi or rales.  Musculoskeletal:        General: Normal range of motion.  Skin:    General: Skin is warm and dry.  Neurological:     General: No focal deficit present.     Mental Status: He is alert and  oriented to person, place, and time. Mental status is at baseline.  Psychiatric:        Mood and Affect: Mood normal.        Behavior: Behavior normal.      UC Treatments / Results  Labs (all labs ordered are listed, but only abnormal results are displayed) Labs Reviewed - No data to display  EKG   Radiology No results found.  Procedures Procedures (including critical care time)  Medications Ordered in UC Medications - No data to display  Initial Impression / Assessment and Plan / UC Course  I have reviewed the triage vital signs and  the nursing notes.  Pertinent labs & imaging results that were available during my care of the patient were reviewed by me and considered in my medical decision making (see chart for details).     MDM: 1.  Eye swelling, right-Advised patient to follow-up with either optometrist/ophthalmologist for further evaluation of right eye swelling.  Contact information provided with this AVS. patient discharged home, hemodynamically stable. Final Clinical Impressions(s) / UC Diagnoses   Final diagnoses:  Eye swelling, right     Discharge Instructions      Advised patient to follow-up with either optometrist/ophthalmologist for further evaluation of right eye swelling.  Contact information provided with this AVS.     ED Prescriptions   None    PDMP not reviewed this encounter.   Teddy Sharper, FNP 03/30/24 [phone]

## 2024-03-30 NOTE — Discharge Instructions (Addendum)
 Advised patient to follow-up with either optometrist/ophthalmologist for further evaluation of right eye swelling.  Contact information provided with this AVS.

## 2024-03-30 NOTE — ED Triage Notes (Addendum)
 Pt /co RT eye irritation since 4am this am. Feels like he has something in it. Tried to rinse it before coming to UC.

## 2024-03-31 ENCOUNTER — Telehealth: Payer: Self-pay

## 2024-03-31 NOTE — Telephone Encounter (Signed)
 Called for follow up reports he was able to be seen by the eye doctor who we referred him to who has sent in rx and he is feeling better.

## 2024-05-13 ENCOUNTER — Other Ambulatory Visit (HOSPITAL_BASED_OUTPATIENT_CLINIC_OR_DEPARTMENT_OTHER): Payer: Self-pay
